# Patient Record
Sex: Female | Born: 1975
Health system: Southern US, Community
[De-identification: ages and names within clinical notes are randomized; demographics above are authoritative.]

## PROBLEM LIST (undated history)

## (undated) DIAGNOSIS — N920 Excessive and frequent menstruation with regular cycle: Secondary | ICD-10-CM

## (undated) DIAGNOSIS — F32A Depression, unspecified: Secondary | ICD-10-CM

## (undated) DIAGNOSIS — R7303 Prediabetes: Secondary | ICD-10-CM

## (undated) DIAGNOSIS — I499 Cardiac arrhythmia, unspecified: Secondary | ICD-10-CM

## (undated) DIAGNOSIS — F329 Major depressive disorder, single episode, unspecified: Secondary | ICD-10-CM

## (undated) DIAGNOSIS — D649 Anemia, unspecified: Secondary | ICD-10-CM

## (undated) DIAGNOSIS — K219 Gastro-esophageal reflux disease without esophagitis: Secondary | ICD-10-CM

## (undated) HISTORY — DX: Prediabetes: R73.03

## (undated) HISTORY — DX: Excessive and frequent menstruation with regular cycle: N92.0

## (undated) HISTORY — PX: TUBAL LIGATION: SHX77

## (undated) HISTORY — DX: Cardiac arrhythmia, unspecified: I49.9

## (undated) HISTORY — PX: BREAST EXCISIONAL BIOPSY: SUR124

## (undated) HISTORY — PX: OTHER SURGICAL HISTORY: SHX169

---

## 1997-10-06 ENCOUNTER — Inpatient Hospital Stay (HOSPITAL_COMMUNITY): Admission: AD | Admit: 1997-10-06 | Discharge: 1997-10-06 | Payer: Self-pay | Admitting: Obstetrics

## 1998-01-01 ENCOUNTER — Emergency Department (HOSPITAL_COMMUNITY): Admission: EM | Admit: 1998-01-01 | Discharge: 1998-01-01 | Payer: Self-pay | Admitting: Emergency Medicine

## 1998-09-04 ENCOUNTER — Inpatient Hospital Stay (HOSPITAL_COMMUNITY): Admission: AD | Admit: 1998-09-04 | Discharge: 1998-09-04 | Payer: Self-pay | Admitting: Obstetrics

## 1998-11-05 ENCOUNTER — Emergency Department (HOSPITAL_COMMUNITY): Admission: EM | Admit: 1998-11-05 | Discharge: 1998-11-06 | Payer: Self-pay | Admitting: Emergency Medicine

## 1998-11-07 ENCOUNTER — Encounter: Payer: Self-pay | Admitting: Emergency Medicine

## 1998-11-07 ENCOUNTER — Emergency Department (HOSPITAL_COMMUNITY): Admission: EM | Admit: 1998-11-07 | Discharge: 1998-11-07 | Payer: Self-pay | Admitting: Emergency Medicine

## 1998-11-23 ENCOUNTER — Ambulatory Visit (HOSPITAL_COMMUNITY): Admission: RE | Admit: 1998-11-23 | Discharge: 1998-11-23 | Payer: Self-pay | Admitting: *Deleted

## 1998-12-22 ENCOUNTER — Ambulatory Visit (HOSPITAL_COMMUNITY): Admission: RE | Admit: 1998-12-22 | Discharge: 1998-12-22 | Payer: Self-pay | Admitting: *Deleted

## 1999-03-18 ENCOUNTER — Inpatient Hospital Stay (HOSPITAL_COMMUNITY): Admission: AD | Admit: 1999-03-18 | Discharge: 1999-03-18 | Payer: Self-pay | Admitting: *Deleted

## 1999-03-22 ENCOUNTER — Inpatient Hospital Stay (HOSPITAL_COMMUNITY): Admission: AD | Admit: 1999-03-22 | Discharge: 1999-03-22 | Payer: Self-pay | Admitting: *Deleted

## 1999-04-19 ENCOUNTER — Inpatient Hospital Stay (HOSPITAL_COMMUNITY): Admission: AD | Admit: 1999-04-19 | Discharge: 1999-04-19 | Payer: Self-pay | Admitting: Obstetrics

## 1999-05-03 ENCOUNTER — Inpatient Hospital Stay (HOSPITAL_COMMUNITY): Admission: AD | Admit: 1999-05-03 | Discharge: 1999-05-03 | Payer: Self-pay | Admitting: *Deleted

## 1999-05-08 ENCOUNTER — Inpatient Hospital Stay (HOSPITAL_COMMUNITY): Admission: AD | Admit: 1999-05-08 | Discharge: 1999-05-08 | Payer: Self-pay | Admitting: Obstetrics

## 1999-05-09 ENCOUNTER — Inpatient Hospital Stay (HOSPITAL_COMMUNITY): Admission: AD | Admit: 1999-05-09 | Discharge: 1999-05-09 | Payer: Self-pay | Admitting: *Deleted

## 1999-05-12 ENCOUNTER — Inpatient Hospital Stay (HOSPITAL_COMMUNITY): Admission: AD | Admit: 1999-05-12 | Discharge: 1999-05-12 | Payer: Self-pay | Admitting: Obstetrics

## 1999-05-15 ENCOUNTER — Inpatient Hospital Stay (HOSPITAL_COMMUNITY): Admission: AD | Admit: 1999-05-15 | Discharge: 1999-05-19 | Payer: Self-pay | Admitting: Obstetrics

## 2000-03-23 ENCOUNTER — Inpatient Hospital Stay (HOSPITAL_COMMUNITY): Admission: AD | Admit: 2000-03-23 | Discharge: 2000-03-23 | Payer: Self-pay | Admitting: *Deleted

## 2000-07-01 ENCOUNTER — Ambulatory Visit (HOSPITAL_COMMUNITY): Admission: RE | Admit: 2000-07-01 | Discharge: 2000-07-01 | Payer: Self-pay | Admitting: *Deleted

## 2000-07-01 ENCOUNTER — Encounter: Payer: Self-pay | Admitting: *Deleted

## 2000-10-17 ENCOUNTER — Observation Stay (HOSPITAL_COMMUNITY): Admission: AD | Admit: 2000-10-17 | Discharge: 2000-10-18 | Payer: Self-pay | Admitting: *Deleted

## 2000-10-27 ENCOUNTER — Inpatient Hospital Stay (HOSPITAL_COMMUNITY): Admission: AD | Admit: 2000-10-27 | Discharge: 2000-10-27 | Payer: Self-pay | Admitting: *Deleted

## 2000-11-01 ENCOUNTER — Observation Stay (HOSPITAL_COMMUNITY): Admission: AD | Admit: 2000-11-01 | Discharge: 2000-11-02 | Payer: Self-pay | Admitting: *Deleted

## 2000-11-02 ENCOUNTER — Observation Stay (HOSPITAL_COMMUNITY): Admission: AD | Admit: 2000-11-02 | Discharge: 2000-11-03 | Payer: Self-pay | Admitting: *Deleted

## 2000-11-08 ENCOUNTER — Inpatient Hospital Stay (HOSPITAL_COMMUNITY): Admission: AD | Admit: 2000-11-08 | Discharge: 2000-11-08 | Payer: Self-pay | Admitting: *Deleted

## 2000-11-09 ENCOUNTER — Inpatient Hospital Stay (HOSPITAL_COMMUNITY): Admission: AD | Admit: 2000-11-09 | Discharge: 2000-11-12 | Payer: Self-pay | Admitting: *Deleted

## 2000-12-18 HISTORY — PX: TUBAL LIGATION: SHX77

## 2001-01-16 ENCOUNTER — Ambulatory Visit (HOSPITAL_COMMUNITY): Admission: RE | Admit: 2001-01-16 | Discharge: 2001-01-16 | Payer: Self-pay | Admitting: *Deleted

## 2001-04-27 ENCOUNTER — Inpatient Hospital Stay (HOSPITAL_COMMUNITY): Admit: 2001-04-27 | Discharge: 2001-04-27 | Payer: Self-pay | Admitting: Obstetrics & Gynecology

## 2001-05-16 ENCOUNTER — Encounter: Admission: RE | Admit: 2001-05-16 | Discharge: 2001-05-16 | Payer: Self-pay | Admitting: Obstetrics & Gynecology

## 2001-05-22 ENCOUNTER — Encounter: Admission: RE | Admit: 2001-05-22 | Discharge: 2001-05-22 | Payer: Self-pay | Admitting: Obstetrics & Gynecology

## 2001-05-22 ENCOUNTER — Encounter: Payer: Self-pay | Admitting: Obstetrics & Gynecology

## 2002-08-03 ENCOUNTER — Encounter: Payer: Self-pay | Admitting: Emergency Medicine

## 2002-08-03 ENCOUNTER — Emergency Department (HOSPITAL_COMMUNITY): Admission: EM | Admit: 2002-08-03 | Discharge: 2002-08-03 | Payer: Self-pay | Admitting: Emergency Medicine

## 2005-04-03 ENCOUNTER — Inpatient Hospital Stay (HOSPITAL_COMMUNITY): Admission: AD | Admit: 2005-04-03 | Discharge: 2005-04-03 | Payer: Self-pay | Admitting: Family Medicine

## 2006-07-09 ENCOUNTER — Emergency Department (HOSPITAL_COMMUNITY): Admission: EM | Admit: 2006-07-09 | Discharge: 2006-07-09 | Payer: Self-pay | Admitting: Emergency Medicine

## 2008-01-19 ENCOUNTER — Emergency Department (HOSPITAL_COMMUNITY): Admission: EM | Admit: 2008-01-19 | Discharge: 2008-01-19 | Payer: Self-pay | Admitting: Emergency Medicine

## 2008-02-18 ENCOUNTER — Emergency Department (HOSPITAL_COMMUNITY): Admission: EM | Admit: 2008-02-18 | Discharge: 2008-02-19 | Payer: Self-pay | Admitting: Family Medicine

## 2009-02-01 ENCOUNTER — Emergency Department (HOSPITAL_COMMUNITY): Admission: EM | Admit: 2009-02-01 | Discharge: 2009-02-01 | Payer: Self-pay | Admitting: Family Medicine

## 2009-04-21 ENCOUNTER — Inpatient Hospital Stay (HOSPITAL_COMMUNITY): Admission: AD | Admit: 2009-04-21 | Discharge: 2009-04-21 | Payer: Self-pay | Admitting: Obstetrics

## 2009-10-12 ENCOUNTER — Emergency Department (HOSPITAL_COMMUNITY): Admission: EM | Admit: 2009-10-12 | Discharge: 2009-10-12 | Payer: Self-pay | Admitting: Emergency Medicine

## 2010-01-19 ENCOUNTER — Emergency Department (HOSPITAL_COMMUNITY): Admission: EM | Admit: 2010-01-19 | Discharge: 2010-01-19 | Payer: Self-pay | Admitting: Emergency Medicine

## 2010-02-23 ENCOUNTER — Inpatient Hospital Stay (HOSPITAL_COMMUNITY): Admission: AD | Admit: 2010-02-23 | Discharge: 2010-02-23 | Payer: Self-pay | Admitting: Obstetrics & Gynecology

## 2010-09-28 ENCOUNTER — Emergency Department (HOSPITAL_COMMUNITY)
Admission: EM | Admit: 2010-09-28 | Discharge: 2010-09-28 | Disposition: A | Discharge status: Home / Self Care | Payer: Self-pay | Attending: Emergency Medicine | Admitting: Emergency Medicine

## 2010-09-28 ENCOUNTER — Emergency Department (HOSPITAL_COMMUNITY): Payer: Self-pay

## 2010-09-28 DIAGNOSIS — R0602 Shortness of breath: Secondary | ICD-10-CM | POA: Insufficient documentation

## 2010-09-28 DIAGNOSIS — R059 Cough, unspecified: Secondary | ICD-10-CM | POA: Insufficient documentation

## 2010-09-28 DIAGNOSIS — R3589 Other polyuria: Secondary | ICD-10-CM | POA: Insufficient documentation

## 2010-09-28 DIAGNOSIS — R358 Other polyuria: Secondary | ICD-10-CM | POA: Insufficient documentation

## 2010-09-28 DIAGNOSIS — R509 Fever, unspecified: Secondary | ICD-10-CM | POA: Insufficient documentation

## 2010-09-28 DIAGNOSIS — R110 Nausea: Secondary | ICD-10-CM | POA: Insufficient documentation

## 2010-09-28 DIAGNOSIS — K219 Gastro-esophageal reflux disease without esophagitis: Secondary | ICD-10-CM | POA: Insufficient documentation

## 2010-09-28 DIAGNOSIS — N12 Tubulo-interstitial nephritis, not specified as acute or chronic: Secondary | ICD-10-CM | POA: Insufficient documentation

## 2010-09-28 DIAGNOSIS — R109 Unspecified abdominal pain: Secondary | ICD-10-CM | POA: Insufficient documentation

## 2010-09-28 DIAGNOSIS — R05 Cough: Secondary | ICD-10-CM | POA: Insufficient documentation

## 2010-09-28 LAB — URINE MICROSCOPIC-ADD ON

## 2010-09-28 LAB — URINALYSIS, ROUTINE W REFLEX MICROSCOPIC
Ketones, ur: NEGATIVE mg/dL
Specific Gravity, Urine: 1.02 (ref 1.005–1.030)
Urobilinogen, UA: 1 mg/dL (ref 0.0–1.0)
pH: 5.5 (ref 5.0–8.0)

## 2010-11-05 LAB — URINALYSIS, ROUTINE W REFLEX MICROSCOPIC
Leukocytes, UA: NEGATIVE
Protein, ur: NEGATIVE mg/dL
Specific Gravity, Urine: 1.025 (ref 1.005–1.030)
Urobilinogen, UA: 0.2 mg/dL (ref 0.0–1.0)

## 2010-11-05 LAB — URINE MICROSCOPIC-ADD ON

## 2010-11-05 LAB — GC/CHLAMYDIA PROBE AMP, GENITAL
Chlamydia, DNA Probe: NEGATIVE
GC Probe Amp, Genital: NEGATIVE

## 2010-11-24 LAB — URINE MICROSCOPIC-ADD ON

## 2010-11-24 LAB — URINALYSIS, ROUTINE W REFLEX MICROSCOPIC
Nitrite: NEGATIVE
Specific Gravity, Urine: 1.02 (ref 1.005–1.030)
Urobilinogen, UA: 1 mg/dL (ref 0.0–1.0)
pH: 7 (ref 5.0–8.0)

## 2010-11-24 LAB — WET PREP, GENITAL
Trich, Wet Prep: NONE SEEN
Yeast Wet Prep HPF POC: NONE SEEN

## 2010-11-24 LAB — URINE CULTURE

## 2010-11-24 LAB — POCT PREGNANCY, URINE: Preg Test, Ur: NEGATIVE

## 2011-05-17 LAB — COMPREHENSIVE METABOLIC PANEL
ALT: 28
BUN: 6
Calcium: 9.1
Glucose, Bld: 92
Sodium: 140
Total Protein: 7

## 2011-05-17 LAB — CBC
Hemoglobin: 11.1 — ABNORMAL LOW
MCHC: 32.5
RDW: 17 — ABNORMAL HIGH

## 2011-05-17 LAB — DIFFERENTIAL
Lymphs Abs: 2.1
Monocytes Relative: 9
Neutro Abs: 4.1
Neutrophils Relative %: 58

## 2011-05-17 LAB — URINALYSIS, ROUTINE W REFLEX MICROSCOPIC
Glucose, UA: NEGATIVE
pH: 7

## 2011-05-17 LAB — T3 UPTAKE: T3 Uptake Ratio: 26.3

## 2011-05-17 LAB — T4, FREE: Free T4: 1.25

## 2011-05-17 LAB — POCT PREGNANCY, URINE
Operator id: 239701
Preg Test, Ur: NEGATIVE

## 2011-05-17 LAB — URINE MICROSCOPIC-ADD ON

## 2011-12-12 ENCOUNTER — Encounter (HOSPITAL_COMMUNITY): Payer: Self-pay

## 2011-12-12 ENCOUNTER — Inpatient Hospital Stay (HOSPITAL_COMMUNITY)
Admission: AD | Admit: 2011-12-12 | Discharge: 2011-12-12 | Disposition: A | Discharge status: Home / Self Care | Payer: Self-pay | Source: Ambulatory Visit | Attending: Obstetrics & Gynecology | Admitting: Obstetrics & Gynecology

## 2011-12-12 DIAGNOSIS — R109 Unspecified abdominal pain: Secondary | ICD-10-CM | POA: Insufficient documentation

## 2011-12-12 DIAGNOSIS — A499 Bacterial infection, unspecified: Secondary | ICD-10-CM

## 2011-12-12 DIAGNOSIS — B9689 Other specified bacterial agents as the cause of diseases classified elsewhere: Secondary | ICD-10-CM

## 2011-12-12 DIAGNOSIS — N760 Acute vaginitis: Secondary | ICD-10-CM

## 2011-12-12 HISTORY — DX: Anemia, unspecified: D64.9

## 2011-12-12 HISTORY — DX: Major depressive disorder, single episode, unspecified: F32.9

## 2011-12-12 HISTORY — DX: Depression, unspecified: F32.A

## 2011-12-12 LAB — WET PREP, GENITAL: Trich, Wet Prep: NONE SEEN

## 2011-12-12 LAB — CBC
MCH: 30.9 pg (ref 26.0–34.0)
MCHC: 32.8 g/dL (ref 30.0–36.0)
MCV: 94.1 fL (ref 78.0–100.0)
Platelets: 238 10*3/uL (ref 150–400)
RBC: 4.24 MIL/uL (ref 3.87–5.11)
RDW: 14.2 % (ref 11.5–15.5)

## 2011-12-12 LAB — DIFFERENTIAL
Basophils Relative: 0 % (ref 0–1)
Eosinophils Absolute: 0.1 10*3/uL (ref 0.0–0.7)
Eosinophils Relative: 2 % (ref 0–5)
Lymphs Abs: 2 10*3/uL (ref 0.7–4.0)
Neutrophils Relative %: 58 % (ref 43–77)

## 2011-12-12 LAB — URINALYSIS, ROUTINE W REFLEX MICROSCOPIC
Bilirubin Urine: NEGATIVE
Glucose, UA: NEGATIVE mg/dL
Hgb urine dipstick: NEGATIVE
Specific Gravity, Urine: 1.015 (ref 1.005–1.030)
pH: 6 (ref 5.0–8.0)

## 2011-12-12 LAB — POCT PREGNANCY, URINE: Preg Test, Ur: NEGATIVE

## 2011-12-12 MED ORDER — METRONIDAZOLE 500 MG PO TABS: 500.0000 mg | ORAL_TABLET | Freq: Two times a day (BID) | ORAL | Status: AC

## 2011-12-12 NOTE — MAU Note (Signed)
Patient states she had had left lower abdominal pain for about 3 days. Has vaginal irritation but no discharge. Has a history of UTI and this feels like that.

## 2011-12-12 NOTE — MAU Note (Signed)
Pt states left sided lower abdominal pain. Vaginal irritation, denies odor or discharge. Denies urgency or burning. Hx frequent uti's. Last intercourse 12/05/2011.

## 2011-12-12 NOTE — Discharge Instructions (Signed)
Bacterial Vaginosis Bacterial vaginosis is an infection of the vagina. A healthy vagina has many kinds of good germs (bacteria). Sometimes the number of good germs can change. This allows bad germs to move in and cause an infection. You may be given medicine (antibiotics) to treat the infection. Or, you may not need treatment at all. HOME CARE  Take your medicine as told. Finish them even if you start to feel better.   Do not have sex until you finish your medicine.   Do not douche.   Practice safe sex.   Tell your sex partner that you have an infection. They should see their doctor for treatment if they have problems.  GET HELP RIGHT AWAY IF:  You do not get better after 3 days of treatment.   You have grey fluid (discharge) coming from your vagina.   You have pain.   You have a temperature of 102 F (38.9 C) or higher.  MAKE SURE YOU:   Understand these instructions.   Will watch your condition.   Will get help right away if you are not doing well or get worse.  Document Released: 05/15/2008 Document Revised: 07/26/2011 Document Reviewed: 05/15/2008 [employer] Patient Information 2012 [employer].Bacterial Vaginosis Bacterial vaginosis is an infection of the vagina. A healthy vagina has many kinds of good germs (bacteria). Sometimes the number of good germs can change. This allows bad germs to move in and cause an infection. You may be given medicine (antibiotics) to treat the infection. Or, you may not need treatment at all. HOME CARE  Take your medicine as told. Finish them even if you start to feel better.   Do not have sex until you finish your medicine.   Do not douche.   Practice safe sex.   Tell your sex partner that you have an infection. They should see their doctor for treatment if they have problems.  GET HELP RIGHT AWAY IF:  You do not get better after 3 days of treatment.   You have grey fluid (discharge) coming from your vagina.   You have pain.    You have a temperature of 102 F (38.9 C) or higher.  MAKE SURE YOU:   Understand these instructions.   Will watch your condition.   Will get help right away if you are not doing well or get worse.  Document Released: 05/15/2008 Document Revised: 07/26/2011 Document Reviewed: 05/15/2008 [employer] Patient Information 2012 [employer]. 

## 2011-12-12 NOTE — MAU Provider Note (Signed)
History     CSN: [id number]  Arrival date and time: 12/12/11 1226   First Provider Initiated Contact with Patient 12/12/11 1346      Chief Complaint  Patient presents with  . Abdominal Pain   HPI Ashley Ford is 36 y.o. [id number] lower left sided pain and vaginal irritation and swelling that began 3days ago.  Sexually active with one partner X 7 years.  Had BTL 2002.  She does not have a doctor.  Had cancer screening at [facility] clinic at [facility] in 2008, they saw a cyst but told her it was normal. LMP 11/24/11.  They are regular.  Denies fever, nausea, vomiting, constipation, diarrhea and poor appetite.   Denies abnormal vaginal discharge or bleeding.  Is concerned about infections.    Past Medical History  Diagnosis Date  . Anemia   . Depression     Past Surgical History  Procedure Date  . Other surgical history     fibroids removed from breasts  . Tubal ligation     Family History  Problem Relation Age of Onset  . Anesthesia problems Neg Hx   . Hypotension Neg Hx   . Malignant hyperthermia Neg Hx   . Pseudochol deficiency Neg Hx     History  Substance Use Topics  . Smoking status: Current Everyday Smoker -- 0.2 packs/day    Types: Cigarettes  . Smokeless tobacco: Never Used  . Alcohol Use: No    Allergies:  Allergies  Allergen Reactions  . Penicillins Other (See Comments)    Swelling and hives    Prescriptions prior to admission  Medication Sig Dispense Refill  . diphenhydrAMINE (BENADRYL) 25 MG tablet Take 25 mg by mouth every 6 (six) hours as needed. For allergies      . Pseudoephedrine-Ibuprofen (AF-IBUP SINUS) 30-200 MG TABS Take 1 tablet by mouth daily as needed. For allergies        Review of Systems  Constitutional: Negative for fever and chills.  Gastrointestinal: Positive for abdominal pain (left lower abdomen). Negative for nausea and vomiting.  Genitourinary: Negative for dysuria and frequency.       + for vaginal irritation and ? swelling    Physical Exam   Blood pressure 117/69, pulse 65, temperature 97.8 F (36.6 C), temperature source Oral, resp. rate 16, height 5\' 10"  (1.778 m), weight 90.084 kg (198 lb 9.6 oz), last menstrual period 11/24/2011, SpO2 99.00%.  Physical Exam  Constitutional: She is oriented to person, place, and time. She appears well-developed and well-nourished. No distress.  HENT:  Head: Normocephalic.  Neck: Normal range of motion.  Cardiovascular: Normal rate.   Respiratory: Effort normal.  GI: Soft. There is tenderness (left lower quadrant, mild). There is no rebound and no guarding.  Genitourinary: There is no tenderness on the right labia. There is no tenderness on the left labia. Uterus is not enlarged and not tender. Cervix exhibits no motion tenderness, no discharge and no friability. Right adnexum displays no mass, no tenderness and no fullness. Left adnexum displays no mass and no fullness. Tenderness: mild  No erythema, tenderness or bleeding around the vagina. Vaginal discharge (white without odor, few clumps) found.  Neurological: She is alert and oriented to person, place, and time.  Skin: Skin is warm and dry.  Psychiatric: She has a normal mood and affect. Her behavior is normal. Judgment and thought content normal.   Results for orders placed during the hospital encounter of 12/12/11 (from the past 24 hour(s))  URINALYSIS,  ROUTINE W REFLEX MICROSCOPIC     Status: Normal   Collection Time   12/12/11 12:45 PM      Component Value Range   Color, Urine YELLOW  YELLOW    APPearance CLEAR  CLEAR    Specific Gravity, Urine 1.015  1.005 - 1.030    pH 6.0  5.0 - 8.0    Glucose, UA NEGATIVE  NEGATIVE (mg/dL)   Hgb urine dipstick NEGATIVE  NEGATIVE    Bilirubin Urine NEGATIVE  NEGATIVE    Ketones, ur NEGATIVE  NEGATIVE (mg/dL)   Protein, ur NEGATIVE  NEGATIVE (mg/dL)   Urobilinogen, UA 0.2  0.0 - 1.0 (mg/dL)   Nitrite NEGATIVE  NEGATIVE    Leukocytes, UA NEGATIVE  NEGATIVE   POCT  PREGNANCY, URINE     Status: Normal   Collection Time   12/12/11  1:04 PM      Component Value Range   Preg Test, Ur NEGATIVE  NEGATIVE   WET PREP, GENITAL     Status: Abnormal   Collection Time   12/12/11  1:58 PM      Component Value Range   Yeast Wet Prep HPF POC FEW (*) NONE SEEN    Trich, Wet Prep NONE SEEN  NONE SEEN    Clue Cells Wet Prep HPF POC MANY (*) NONE SEEN    WBC, Wet Prep HPF POC FEW (*) NONE SEEN   CBC     Status: Normal   Collection Time   12/12/11  2:20 PM      Component Value Range   WBC 6.1  4.0 - 10.5 (K/uL)   RBC 4.24  3.87 - 5.11 (MIL/uL)   Hemoglobin 13.1  12.0 - 15.0 (g/dL)   HCT 29.5  28.4 - 13.2 (%)   MCV 94.1  78.0 - 100.0 (fL)   MCH 30.9  26.0 - 34.0 (pg)   MCHC 32.8  30.0 - 36.0 (g/dL)   RDW 44.0  10.2 - 72.5 (%)   Platelets 238  150 - 400 (K/uL)  DIFFERENTIAL     Status: Normal   Collection Time   12/12/11  2:20 PM      Component Value Range   Neutrophils Relative 58  43 - 77 (%)   Neutro Abs 3.6  1.7 - 7.7 (K/uL)   Lymphocytes Relative 33  12 - 46 (%)   Lymphs Abs 2.0  0.7 - 4.0 (K/uL)   Monocytes Relative 7  3 - 12 (%)   Monocytes Absolute 0.4  0.1 - 1.0 (K/uL)   Eosinophils Relative 2  0 - 5 (%)   Eosinophils Absolute 0.1  0.0 - 0.7 (K/uL)   Basophils Relative 0  0 - 1 (%)   Basophils Absolute 0.0  0.0 - 0.1 (K/uL)   MAU Course  Procedures  GC/CHL culture to lab  MDM  Instructions given to take all medication and avoid intercourse for 1 week.   Assessment and Plan  A: Bacterial  Vaginosis    P:  Rx for flagyl       Follow up with MD of her choice if needed  Bond Grieshop,EVE M 12/12/2011, 1:47 PM

## 2011-12-13 LAB — GC/CHLAMYDIA PROBE AMP, GENITAL: GC Probe Amp, Genital: NEGATIVE

## 2011-12-13 NOTE — MAU Provider Note (Signed)
Attestation of Attending Supervision of Advanced Practitioner: Evaluation and management procedures were performed by the [facility] Fellow/PA/CNM/NP under my supervision and collaboration. Chart reviewed, and agree with management and plan.  Jaynie Collins, M.D. 12/13/2011 11:02 AM

## 2012-10-16 ENCOUNTER — Emergency Department (HOSPITAL_COMMUNITY)
Admission: EM | Admit: 2012-10-16 | Discharge: 2012-10-16 | Disposition: A | Discharge status: Home / Self Care | Payer: Self-pay | Attending: Emergency Medicine | Admitting: Emergency Medicine

## 2012-10-16 ENCOUNTER — Encounter (HOSPITAL_COMMUNITY): Payer: Self-pay | Admitting: Emergency Medicine

## 2012-10-16 DIAGNOSIS — S025XXA Fracture of tooth (traumatic), initial encounter for closed fracture: Secondary | ICD-10-CM | POA: Insufficient documentation

## 2012-10-16 DIAGNOSIS — K0401 Reversible pulpitis: Secondary | ICD-10-CM | POA: Insufficient documentation

## 2012-10-16 DIAGNOSIS — K044 Acute apical periodontitis of pulpal origin: Secondary | ICD-10-CM | POA: Insufficient documentation

## 2012-10-16 DIAGNOSIS — F17200 Nicotine dependence, unspecified, uncomplicated: Secondary | ICD-10-CM | POA: Insufficient documentation

## 2012-10-16 DIAGNOSIS — Y939 Activity, unspecified: Secondary | ICD-10-CM | POA: Insufficient documentation

## 2012-10-16 DIAGNOSIS — Z862 Personal history of diseases of the blood and blood-forming organs and certain disorders involving the immune mechanism: Secondary | ICD-10-CM | POA: Insufficient documentation

## 2012-10-16 DIAGNOSIS — X58XXXA Exposure to other specified factors, initial encounter: Secondary | ICD-10-CM | POA: Insufficient documentation

## 2012-10-16 DIAGNOSIS — K047 Periapical abscess without sinus: Secondary | ICD-10-CM

## 2012-10-16 DIAGNOSIS — Z8659 Personal history of other mental and behavioral disorders: Secondary | ICD-10-CM | POA: Insufficient documentation

## 2012-10-16 DIAGNOSIS — Y929 Unspecified place or not applicable: Secondary | ICD-10-CM | POA: Insufficient documentation

## 2012-10-16 DIAGNOSIS — R220 Localized swelling, mass and lump, head: Secondary | ICD-10-CM | POA: Insufficient documentation

## 2012-10-16 DIAGNOSIS — R5383 Other fatigue: Secondary | ICD-10-CM | POA: Insufficient documentation

## 2012-10-16 DIAGNOSIS — R5381 Other malaise: Secondary | ICD-10-CM | POA: Insufficient documentation

## 2012-10-16 MED ORDER — IBUPROFEN 600 MG PO TABS: 600.0000 mg | ORAL_TABLET | Freq: Four times a day (QID) | ORAL | Status: DC | PRN

## 2012-10-16 MED ORDER — CLINDAMYCIN HCL 150 MG PO CAPS: 150.0000 mg | ORAL_CAPSULE | Freq: Four times a day (QID) | ORAL | Status: DC

## 2012-10-16 MED ORDER — CLINDAMYCIN HCL 150 MG PO CAPS
150.0000 mg | ORAL_CAPSULE | Freq: Once | ORAL | Status: AC
  Administered 2012-10-16: 150 mg via ORAL
  Filled 2012-10-16: qty 1

## 2012-10-16 MED ORDER — IBUPROFEN 400 MG PO TABS
600.0000 mg | ORAL_TABLET | Freq: Once | ORAL | Status: AC
Start: 2012-10-16 — End: 2012-10-16
  Administered 2012-10-16: 600 mg via ORAL
  Filled 2012-10-16: qty 1

## 2012-10-16 NOTE — ED Provider Notes (Signed)
History     CSN: [id number]  Arrival date & time 10/16/12  7829   First MD Initiated Contact with Patient 10/16/12 [phone]      Chief Complaint  Patient presents with  . Dental Pain    (Consider location/radiation/quality/duration/timing/severity/associated sxs/prior treatment) HPI Comments: 37 yo female presents with tooth ache and malaise. She states she had atraumatic fracture/avulsion of left upper 3rd molar last week. Since has gradually developed malaise. She states the tooth initially hurt but now is not painful. She feels as though she may have swelling to left side of cheek. She has no fever or chills. She has had no drainage. She has had no trouble swallowing, trismus, or difficulty breathing. She has history of multiple caries, fillings, and teeth being pulled. Affected tooth has history of filling. She denies other significant PMH and states she takes no medicines every day. She is allergic to Penicillin.   Patient is a 37 y.o. female presenting with tooth pain. The history is provided by the patient. No language interpreter was used.  Dental PainThe primary symptoms include mouth pain and dental injury. Primary symptoms do not include headaches, fever, shortness of breath, sore throat or cough.    Past Medical History  Diagnosis Date  . Anemia   . Depression     Past Surgical History  Procedure Laterality Date  . Other surgical history      fibroids removed from breasts  . Tubal ligation      Family History  Problem Relation Age of Onset  . Anesthesia problems Neg Hx   . Hypotension Neg Hx   . Malignant hyperthermia Neg Hx   . Pseudochol deficiency Neg Hx     History  Substance Use Topics  . Smoking status: Current Every Day Smoker -- 0.25 packs/day    Types: Cigarettes  . Smokeless tobacco: Never Used  . Alcohol Use: No    OB History   Grav Para Term Preterm Abortions TAB SAB Ect Mult Living   3 2 2  1  1   2       Review of Systems  Constitutional:  Negative for fever and chills.  HENT: Positive for dental problem. Negative for sore throat and neck pain.   Eyes: Negative for visual disturbance.  Respiratory: Negative for cough and shortness of breath.   Cardiovascular: Negative for chest pain.  Gastrointestinal: Negative for nausea, vomiting, abdominal pain and diarrhea.  Genitourinary: Negative for dysuria and frequency.  Musculoskeletal: Negative for back pain.  Skin: Negative for rash.  Neurological: Negative for weakness, numbness and headaches.  Hematological: Negative for adenopathy.  Psychiatric/Behavioral: Negative for behavioral problems.    Allergies  Penicillins  Home Medications   Current Outpatient Rx  Name  Route  Sig  Dispense  Refill  . ibuprofen (ADVIL,MOTRIN) 200 MG tablet   Oral   Take 200 mg by mouth every 6 (six) hours as needed for pain or fever.         . clindamycin (CLEOCIN) 150 MG capsule   Oral   Take 1 capsule (150 mg total) by mouth every 6 (six) hours.   28 capsule   0   . ibuprofen (ADVIL,MOTRIN) 600 MG tablet   Oral   Take 1 tablet (600 mg total) by mouth every 6 (six) hours as needed for pain.   30 tablet   0     BP 112/80  Pulse 80  Temp(Src) 98.6 F (37 C) (Oral)  Resp 18  SpO2 98%  Physical Exam  Nursing note and vitals reviewed. Constitutional: She appears well-developed and well-nourished. No distress.  Well appearing female with normal VS.  HENT:  Head: Normocephalic and atraumatic.  Mouth/Throat: Oropharynx is clear and moist. No oropharyngeal exudate.    Filling present. Tooth is fractured to posterior aspect. No dentin or pulp exposed. Tooth is non tender to percussion. There is no abscess or cellulitis noted.  Eyes: Conjunctivae and EOM are normal. Pupils are equal, round, and reactive to light. Right eye exhibits no discharge. Left eye exhibits no discharge. No scleral icterus.  Neck: Normal range of motion. Neck supple. No JVD present. No thyromegaly present.   Cardiovascular: Normal rate, regular rhythm, normal heart sounds and intact distal pulses.  Exam reveals no gallop and no friction rub.   No murmur heard. Pulmonary/Chest: Effort normal and breath sounds normal. No respiratory distress. She has no wheezes. She has no rales.  Abdominal: Soft. Bowel sounds are normal. She exhibits no distension and no mass. There is no tenderness.  Musculoskeletal: Normal range of motion. She exhibits no edema and no tenderness.  Lymphadenopathy:    She has no cervical adenopathy.  Neurological: She is alert. Coordination normal.  Skin: Skin is warm and dry. No rash noted. No erythema.  Psychiatric: She has a normal mood and affect. Her behavior is normal.    ED Course  Procedures (including critical care time)  Labs Reviewed - No data to display No results found.   1. Dental infection       MDM  37 yo female with dental fracture/pain now resolving with some malaise and concern for developing abscess. DDx: pulpitis, dental fracture, early infection. Treat with Clindamycin and dental follow up in 48 hours. Tooth will likely need to be extracted.         Vida Roller, MD 10/16/12 1006

## 2012-10-16 NOTE — ED Notes (Signed)
Pt c/o left upper dental pain x several weeks from broken tooth

## 2012-10-20 ENCOUNTER — Telehealth (HOSPITAL_COMMUNITY): Payer: Self-pay | Admitting: Emergency Medicine

## 2012-10-20 NOTE — ED Notes (Signed)
Call from on call dentist requesting demographics and referral be faxed to their office. 

## 2014-06-01 ENCOUNTER — Emergency Department (HOSPITAL_COMMUNITY)
Admission: EM | Admit: 2014-06-01 | Discharge: 2014-06-01 | Disposition: A | Discharge status: Home / Self Care | Payer: Self-pay | Attending: Emergency Medicine | Admitting: Emergency Medicine

## 2014-06-01 ENCOUNTER — Encounter (HOSPITAL_COMMUNITY): Payer: Self-pay | Admitting: Emergency Medicine

## 2014-06-01 DIAGNOSIS — Z880 Allergy status to penicillin: Secondary | ICD-10-CM | POA: Insufficient documentation

## 2014-06-01 DIAGNOSIS — Z862 Personal history of diseases of the blood and blood-forming organs and certain disorders involving the immune mechanism: Secondary | ICD-10-CM | POA: Insufficient documentation

## 2014-06-01 DIAGNOSIS — B3731 Acute candidiasis of vulva and vagina: Secondary | ICD-10-CM

## 2014-06-01 DIAGNOSIS — Z8719 Personal history of other diseases of the digestive system: Secondary | ICD-10-CM | POA: Insufficient documentation

## 2014-06-01 DIAGNOSIS — Z720 Tobacco use: Secondary | ICD-10-CM | POA: Insufficient documentation

## 2014-06-01 DIAGNOSIS — B373 Candidiasis of vulva and vagina: Secondary | ICD-10-CM | POA: Insufficient documentation

## 2014-06-01 DIAGNOSIS — Z8659 Personal history of other mental and behavioral disorders: Secondary | ICD-10-CM | POA: Insufficient documentation

## 2014-06-01 DIAGNOSIS — Z3202 Encounter for pregnancy test, result negative: Secondary | ICD-10-CM | POA: Insufficient documentation

## 2014-06-01 HISTORY — DX: Gastro-esophageal reflux disease without esophagitis: K21.9

## 2014-06-01 LAB — WET PREP, GENITAL
Clue Cells Wet Prep HPF POC: NONE SEEN
TRICH WET PREP: NONE SEEN
YEAST WET PREP: NONE SEEN

## 2014-06-01 LAB — URINALYSIS, ROUTINE W REFLEX MICROSCOPIC
BILIRUBIN URINE: NEGATIVE
Glucose, UA: NEGATIVE mg/dL
HGB URINE DIPSTICK: NEGATIVE
Ketones, ur: NEGATIVE mg/dL
NITRITE: NEGATIVE
PH: 5 (ref 5.0–8.0)
Protein, ur: NEGATIVE mg/dL
SPECIFIC GRAVITY, URINE: 1.018 (ref 1.005–1.030)
Urobilinogen, UA: 0.2 mg/dL (ref 0.0–1.0)

## 2014-06-01 LAB — URINE MICROSCOPIC-ADD ON

## 2014-06-01 LAB — HIV ANTIBODY (ROUTINE TESTING W REFLEX): HIV 1&2 Ab, 4th Generation: NONREACTIVE

## 2014-06-01 LAB — RPR

## 2014-06-01 LAB — PREGNANCY, URINE: PREG TEST UR: NEGATIVE

## 2014-06-01 MED ORDER — FLUCONAZOLE 200 MG PO TABS: 200.0000 mg | ORAL_TABLET | Freq: Every day | ORAL | Status: AC

## 2014-06-01 NOTE — Discharge Instructions (Signed)
Candidal Vulvovaginitis Candidal vulvovaginitis is an infection of the vagina and vulva. The vulva is the skin around the opening of the vagina. This may cause itching and discomfort in and around the vagina.  HOME CARE  Only take medicine as told by your doctor.  Do not have sex (intercourse) until the infection is healed or as told by your doctor.  Practice safe sex.  Tell your sex partner about your infection.  Do not douche or use tampons.  Wear cotton underwear. Do not wear tight pants or panty hose.  Eat yogurt. This may help treat and prevent yeast infections. GET HELP RIGHT AWAY IF:   You have a fever.  Your problems get worse during treatment or do not get better in 3 days.  You have discomfort, irritation, or itching in your vagina or vulva area.  You have pain after sex.  You start to get belly (abdominal) pain. MAKE SURE YOU:  Understand these instructions.  Will watch your condition.  Will get help right away if you are not doing well or get worse. Document Released: 11/02/2008 Document Revised: 08/11/2013 Document Reviewed: 11/02/2008 [facility] Patient Information 2015 [employer], [place]. This information is not intended to replace advice given to you by your health care provider. Make sure you discuss any questions you have with your health care provider.     Emergency Department Resource Guide 1) Find a Doctor and Pay Out of Pocket Although you won't have to find out who is covered by your insurance plan, it is a good idea to ask around and get recommendations. You will then need to call the office and see if the doctor you have chosen will accept you as a new patient and what types of options they offer for patients who are self-pay. Some doctors offer discounts or will set up payment plans for their patients who do not have insurance, but you will need to ask so you aren't surprised when you get to your appointment.  2) Contact Your Local Health  Department Not all health departments have doctors that can see patients for sick visits, but many do, so it is worth a call to see if yours does. If you don't know where your local health department is, you can check in your phone book. The CDC also has a tool to help you locate your state's health department, and many state websites also have listings of all of their local health departments.  3) Find a Walk-in Clinic If your illness is not likely to be very severe or complicated, you may want to try a walk in clinic. These are popping up all over the country in pharmacies, drugstores, and shopping centers. They're usually staffed by nurse practitioners or physician assistants that have been trained to treat common illnesses and complaints. They're usually fairly quick and inexpensive. However, if you have serious medical issues or chronic medical problems, these are probably not your best option.  No Primary Care Doctor: - Call Health Connect at  [phone] - they can help you locate a primary care doctor that  accepts your insurance, provides certain services, etc. - Physician Referral Service- [phone]  Chronic Pain Problems: Organization         Address  Phone   Notes  [facility]  [phone] Patients need to be referred by their primary care doctor.   Medication Assistance: Organization         Address  Phone   Notes  [facility]  Medication Assistance Program [address] [place], [place] [address] [phone] --Must be a resident of [facility] -- Must have NO insurance coverage whatsoever (no Medicaid/ Medicare, etc.) -- The pt. MUST have a primary care doctor that directs their care regularly and follows them in the community   MedAssist  [phone]   [employer]  [phone]    Agencies that provide inexpensive medical care: Organization         Address  Phone   Notes  [facility]  [phone]   [facility]    [phone]   [facility] [address] [place], [place] [address] [phone]   [facility] [address] [place], [place] [phone]   Planned Parenthood    [phone]   [facility]    [phone]   [facility]  [address], [place] Phone:  [phone], Fax:  [phone] Hours of Operation:  9 am - 6 pm, M-F.  Also accepts Medicaid/Medicare and self-pay.  [facility]  [address], [place] Phone: [phone], Fax: [phone]. Hours of Operation:  8:30 am - 5:30 pm, M-F.  Also accepts Medicaid and self-pay.  [facility] [place] [address], [place] Phone: [phone]   [facility] [address] Natasha Bence [place], [place] [phone], Ext. [phone] Mondays & Thursdays: 7-9 AM.  First 15 patients are seen on a first come, first serve basis.    Medicaid-accepting [facility] Providers:  Organization         Address  Phone   Notes  [facility] [address], [place] [phone] Also accepts self-pay patients.  [facility] [address] Laurell Josephs [place], [place]  [phone]   [facility] [address], [place] [phone]   [facility] [address], [place] [phone]   Renaye Rakers [address], [place]   [phone] Only accepts [place] patients after they have their name applied to their card.   Self-Pay (no insurance) in [facility]:  Organization         Address  Phone   Notes  Sickle Cell Patients, [facility] [address] [place], [place] [phone]   [facility] Urgent Care [address] [place], [place] [phone]   [facility] [place]  [address],  [place] [phone]   [facility]/Dr. Osei-Bonsu  [address], [place] or [address], [place] [phone] Phone number for both [place] and [place] locations is the same.  [facility] [address] [place] [phone]   [facility] [address] [place] or [address] [phone]   [facility] [address], [place] [phone], phone; [phone], fax Sees patients 1st and 3rd Saturday of every month.  Must not qualify for public or private insurance (i.e. Medicaid, Medicare, [place] Health Choice, Veterans' Benefits)  Household income should be no more than 200% of the poverty level The clinic cannot treat you if you are pregnant or think you are pregnant  Sexually transmitted diseases are not treated  at the clinic.    Dental Care: Organization         Address  Phone  Notes  [facility] [address] [place], [place] [phone] Accepts children up to age 72 who are enrolled in [place] or [place] Health Choice; pregnant women with a Medicaid card; and children who have applied for Medicaid or [place] Health Choice, but were declined, whose parents can pay a reduced fee at time of service.  [facility]  [address], [place] [phone] Accepts children up to age 37 who are enrolled in [place] or  Health Choice; pregnant women with a Medicaid card; and children who have applied for Medicaid or  Health Choice, but were declined, whose parents can pay a reduced fee at time of service.  [facility]  [address] [place], [place] [phone] Patients are seen by appointment only. Walk-ins are not accepted. [facility] will see patients 75 years of age and older. Monday - Tuesday (8am-5pm) Most Wednesdays  (8:30-5pm) $30 per visit, cash only  [facility]  [address], [facility] [phone] Patients are seen by appointment only. Walk-ins are not accepted. [facility] will see patients 95 years of age and older. One Wednesday Evening (Monthly: Volunteer Based).  $30 per visit, cash only  [employer]  [phone] for adults; Children under age 49, call Graduate Pediatric Dentistry at [phone]. Children aged 85-14, please call [phone] to request a pediatric application.  Dental services are provided in all areas of dental care including fillings, crowns and bridges, complete and partial dentures, implants, gum treatment, root canals, and extractions. Preventive care is also provided. Treatment is provided to both adults and children. Patients are selected via a lottery and there is often a waiting list.   [facility] [address], [place]  [phone] [email or web address]   [facility] [address] [place], [place] [phone], Ext. [phone] Second and Fourth Thursday of each month, opens at 6:30 AM; Clinic ends at 9 AM.  Patients are seen on a first-come first-served basis, and a limited number are seen during each clinic.   [facility]  [address] Ether Griffins [place], [place] [phone]   Eligibility Requirements You must have lived in [place], [place], or [place] counties for at least the last three months.   You cannot be eligible for state or federal sponsored [place], including [employer], [place], or [employer].   You generally cannot be eligible for healthcare insurance through your employer.    How to apply: Eligibility screenings are held every Tuesday and Wednesday afternoon from 1:00 pm until 4:00 pm. You do not need an appointment for the interview!  [facility] [address] [place], [place] [phone]   [facility]  Health Department  [phone]   [facility] Health Department  [phone]   [facility] Health Department  [phone]    Behavioral Health Resources in the Community: Intensive Outpatient Programs Organization         Address  Phone  Notes  [facility] [address] [place], [place] [phone]   [facility] Outpatient [address] [place], [place] [phone]   [facility] [address], [place], [place]  [phone]   [facility]  Mental Health [address],  [place], [place] [phone] or [phone]   Substance Abuse Resources Organization         Address  Phone  Notes  [facility]  [phone]   [facility]  [phone]   [facility]  [phone]   [facility]  [phone]   Residential & Outpatient Substance Abuse Program  [phone]   Psychological Services Organization         Address  Phone  Notes  [facility]  [phone]- [phone]   [facility]  [phone]- [phone]   [facility] [address], [place] [phone] or [phone]    Mobile Crisis Teams Organization         Address  Phone  Notes  [facility], Mobile Crisis Care Unit  [phone]   [facility]  [address] [place], [place] [phone]   Doristine LocksSharon DeEsch [address] [place] [phone]    Self-Help/Support Groups Organization         Address  Phone             Notes  [facility] [place] - variety of support groups  [phone] Call for more information  Narcotics Anonymous (NA), [facility] [address], [place]  2 meetings at this location   Statisticianesidential Treatment Programs Organization         Address  Phone  Notes  [facility] [address],    [place]  [phone]   [facility]  [address], [place] [id number], [place], [place]  [phone]   [facility] [address], [place] [phone] Admissions: 8am-3pm M-F  [facility] [address]    [place], [place] [phone]   [facility] [address], [place], [place] [phone]   [facility] [address]  [place], [place] [phone]   [facility] - Intensive Outpatient [address], [place], [place] [phone]   [facility] ([facility] [address]  [place], [place] [phone] or [phone]   [facility] ([facility]) [address] [place], [place] [phone] Accepts Medicaid  [facility] [address]  [place] [phone] Substance Abuse/Addiction Treatment   [facility] Behavioral Health Resources Organization         Address  Phone  Notes  [facility]  [phone]) [phone]   Angie FavaJulie Brannon, PhD [address] [place], [place]   [phone] or [phone]   [facility]   [address] [place], [place] [phone]   [facility] [address], [place], [place] [phone] Insurance/Medicaid/sponsorship through [facility]  [facility] [address]                                    [place], [place] [phone] Therapy/tele-psych/case  [facility] [address]   [place], [place] [phone](336) [phone]    Dr. Lolly MustacheArfeen  [phone](336) [phone]   [facility]  [employer] [facility] 1) [address],  2) [address], [place] 3)  [address], [place] [phone]  [phone](336) [phone]   [facility] Child Abuse Hotline [phone] or [phone] (After Hours)

## 2014-06-01 NOTE — ED Notes (Signed)
Pt in restroom 

## 2014-06-01 NOTE — ED Notes (Signed)
Pt states she has vaginal irritation and redness; states tubes tied [facility]. No insurance. States she thinks she has yeast infections. Has acid reflux and thinks she also has belly pain but due to that.

## 2014-06-01 NOTE — ED Provider Notes (Signed)
  Medical screening examination/treatment/procedure(s) were performed by non-physician practitioner and as supervising physician I was immediately available for consultation/collaboration.   EKG Interpretation None         Gerhard Munchobert Lily Velasquez, MD 06/01/14 1728

## 2014-06-01 NOTE — ED Provider Notes (Signed)
CSN: [id number]     Arrival date & time 06/01/14  [id number] History   First MD Initiated Contact with Patient 06/01/14 [phone]     Chief Complaint  Patient presents with  . Vaginal Pain     (Consider location/radiation/quality/duration/timing/severity/associated sxs/prior Treatment) HPI Ashley Ford is a 38 y.o. female with no significant past medical history comes in for evaluation of vaginal irritation. Patient states approximately 3 days ago began to have swelling in her vagina and reports the skin is peeling. She has been taking topical metronidazole the previous 2 days without relief, she did not use it today because she knew she was coming to the emergency room for evaluation. Last intercourse was one week ago she did use a condom. She denies any new feminine hygiene products, soaps, perfumes, sexual toys, creams or lotions. Her last menstrual period began September 13. She also reports associated dysuria without hematuria. She denies fevers, abdominal pain, nausea vomiting constipation or diarrhea, no numbness or weakness.  Past Medical History  Diagnosis Date  . Anemia   . Depression   . GERD (gastroesophageal reflux disease)    Past Surgical History  Procedure Laterality Date  . Other surgical history      fibroids removed from breasts  . Tubal ligation     Family History  Problem Relation Age of Onset  . Anesthesia problems Neg Hx   . Hypotension Neg Hx   . Malignant hyperthermia Neg Hx   . Pseudochol deficiency Neg Hx    History  Substance Use Topics  . Smoking status: Current Every Day Smoker -- 0.25 packs/day    Types: Cigarettes  . Smokeless tobacco: Never Used  . Alcohol Use: No   OB History   Grav Para Term Preterm Abortions TAB SAB Ect Mult Living   3 2 2  1  1   2      Review of Systems  Constitutional: Negative for fever.  HENT: Negative for sore throat.   Eyes: Negative for visual disturbance.  Respiratory: Negative for shortness of breath.    Cardiovascular: Negative for chest pain.  Gastrointestinal: Negative for abdominal pain.  Endocrine: Negative for polyuria.  Genitourinary: Positive for vaginal discharge and vaginal pain. Negative for dysuria and vaginal bleeding.  Skin: Negative for rash.  Neurological: Negative for headaches.      Allergies  Penicillins  Home Medications   Prior to Admission medications   Medication Sig Start Date End Date Taking? Authorizing Provider  metroNIDAZOLE (METROCREAM) 0.75 % cream Apply 1 application topically once.   Yes Historical Provider, MD   BP 129/80  Pulse 84  Temp(Src) 98.7 F (37.1 C) (Oral)  Resp 18  Ht 5\' 10"  (1.778 m)  Wt 225 lb (102.059 kg)  BMI 32.28 kg/m2  SpO2 100%  LMP 05/01/2014 Physical Exam  Nursing note and vitals reviewed. Constitutional: She is oriented to person, place, and time. She appears well-developed and well-nourished.  HENT:  Head: Normocephalic and atraumatic.  Mouth/Throat: Oropharynx is clear and moist.  Eyes: Conjunctivae are normal. Pupils are equal, round, and reactive to light. Right eye exhibits no discharge. Left eye exhibits no discharge. No scleral icterus.  Neck: Neck supple.  Cardiovascular: Normal rate, regular rhythm and normal heart sounds.   Pulmonary/Chest: Effort normal and breath sounds normal. No respiratory distress. She has no wheezes. She has no rales.  Abdominal: Soft. There is no tenderness.  Genitourinary:  Chaperone was present for the entire genital exam. No lesions or rashes appreciated on  vulva. Cervix visualized on speculum exam and appropriate cultures sampled. No blood in vaginal vault. Curdy white discharge Upon bi manual exam- No TTP of the adnexa, no cervical motion tenderness. No fullness or masses appreciated. No abnormalities appreciated in structural anatomy.   Musculoskeletal: She exhibits no tenderness.  Neurological: She is alert and oriented to person, place, and time.  Cranial Nerves II-XII  grossly intact  Skin: Skin is warm and dry. No rash noted.  Psychiatric: She has a normal mood and affect.    ED Course  Procedures (including critical care time) Labs Review Labs Reviewed  WET PREP, GENITAL  GC/CHLAMYDIA PROBE AMP  URINALYSIS, ROUTINE W REFLEX MICROSCOPIC  PREGNANCY, URINE  RPR  HIV ANTIBODY (ROUTINE TESTING)    Imaging Review No results found.   EKG Interpretation None     Meds given in ED:  Medications - No data to display  New Prescriptions   No medications on file   Filed Vitals:   06/01/14 0914  BP: 129/80  Pulse: 84  Temp: 98.7 F (37.1 C)  TempSrc: Oral  Resp: 18  Height: 5\' 10"  (1.778 m)  Weight: 225 lb (102.059 kg)  SpO2: 100%    MDM  Vitals stable - WNL -afebrile Pt resting comfortably in ED. PE shows no sign of acute or emergent pathology. Pelvic exam consistent with yeast infection will treat with Diflucan. Recommend appropriate followup with primary care or women's center. Discussed symptomatic treatment for vaginal pain. Cool compresses, 100% cotton underwear and pads, avoid tight fitting clothing, avoidance of new or different feminine products, perfumes. Labwork essentially normal and noncontributory. No evidence of UTI.  Will DC with Discussed f[facility] and return precautions, pt very amenable to plan. Patient stable, in good condition and is appropriate for discharge  Prior to patient discharge, I discussed and reviewed this case with Dr.Lockwood    Final diagnoses:  Vaginal yeast infection        Ashley MottsBenjamin W Jenene Kauffmann, PA-C 06/01/14 1247

## 2014-06-01 NOTE — Discharge Planning (Signed)
[facility] Community Health & Eligibility Specialist ° °Spoke to the patient regarding primary care resources and the [facility] orange card. Orange card application and instructions provided. Resource guide and my contact information given for any future questions or concerns. No other Community Health & Eligibility Specialist needs identified at this time.  °

## 2014-06-02 LAB — GC/CHLAMYDIA PROBE AMP
CT PROBE, AMP APTIMA: NEGATIVE
GC Probe RNA: NEGATIVE

## 2014-06-21 ENCOUNTER — Encounter (HOSPITAL_COMMUNITY): Payer: Self-pay | Admitting: Emergency Medicine

## 2016-11-12 ENCOUNTER — Ambulatory Visit (HOSPITAL_COMMUNITY)
Admission: EM | Admit: 2016-11-12 | Discharge: 2016-11-12 | Disposition: A | Discharge status: Home / Self Care | Payer: Self-pay | Attending: Internal Medicine | Admitting: Internal Medicine

## 2016-11-12 ENCOUNTER — Encounter (HOSPITAL_COMMUNITY): Payer: Self-pay | Admitting: Emergency Medicine

## 2016-11-12 DIAGNOSIS — L2082 Flexural eczema: Secondary | ICD-10-CM

## 2016-11-12 DIAGNOSIS — R21 Rash and other nonspecific skin eruption: Secondary | ICD-10-CM

## 2016-11-12 MED ORDER — TRIAMCINOLONE ACETONIDE 0.1 % EX CREA
1.0000 | TOPICAL_CREAM | Freq: Two times a day (BID) | CUTANEOUS | 0 refills | Status: DC
Start: 2016-11-12 — End: 2017-01-04

## 2016-11-12 NOTE — ED Triage Notes (Signed)
[facility] a [facility] of a rash on both hands that has been recurrent from last year.

## 2016-11-12 NOTE — ED Provider Notes (Signed)
CSN: [id number]     Arrival date & time 11/12/16  1357 History   First MD Initiated Contact with Patient 11/12/16 1504     Chief Complaint  Patient presents with  . Rash   (Consider location/radiation/quality/duration/timing/severity/associated sxs/prior Treatment) HPI  Ashley Ford is a 41 y.o. female presenting to UC with c/o dry, itching, flaking rash on palms of both hands. Hx of similar rash about 1 year ago but she notes that eventually resolved on its own.  She has tried OTC creams but no relief. She notes she has recently been using latex gloves at work and is concerned that is what caused current symptoms.  No other rashes.    Past Medical History:  Diagnosis Date  . Anemia   . Depression   . GERD (gastroesophageal reflux disease)    Past Surgical History:  Procedure Laterality Date  . OTHER SURGICAL HISTORY     fibroids removed from breasts  . TUBAL LIGATION     Family History  Problem Relation Age of Onset  . Anesthesia problems Neg Hx   . Hypotension Neg Hx   . Malignant hyperthermia Neg Hx   . Pseudochol deficiency Neg Hx    Social History  Substance Use Topics  . Smoking status: Current Every Day Smoker    Packs/day: 0.25    Types: Cigarettes  . Smokeless tobacco: Never Used  . Alcohol use No   OB History    Gravida Para Term Preterm AB Living   3 2 2   1 2    SAB TAB Ectopic Multiple Live Births   1             Review of Systems  Musculoskeletal: Negative for arthralgias, joint swelling and myalgias.  Skin: Positive for rash. Negative for color change and wound.    Allergies  Penicillins  Home Medications   Prior to Admission medications   Medication Sig Start Date End Date Taking? Authorizing Provider  triamcinolone cream (KENALOG) 0.1 % Apply 1 application topically 2 (two) times daily. 11/12/16   Junius Finner, PA-C   Meds Ordered and Administered this Visit  Medications - No data to display  BP 124/73 (BP Location: Right Arm)   Pulse 86    Temp 99 F (37.2 C) (Oral)   Resp 18   SpO2 100%  No data found.   Physical Exam  Constitutional: She is oriented to person, place, and time. She appears well-developed and well-nourished. No distress.  HENT:  Head: Normocephalic and atraumatic.  Eyes: EOM are normal.  Neck: Normal range of motion.  Cardiovascular: Normal rate.   Pulmonary/Chest: Effort normal.  Musculoskeletal: Normal range of motion.  Neurological: She is alert and oriented to person, place, and time.  Skin: Skin is warm and dry. Rash noted. She is not diaphoretic.  Bilateral palms: dried flaking skin with faint underlying erythema. Non-tender. No bleeding or drainage.   Psychiatric: She has a normal mood and affect. Her behavior is normal.  Nursing note and vitals reviewed.   Urgent Care Course     Procedures (including critical care time)  Labs Review Labs Reviewed - No data to display  Imaging Review No results found.   MDM   1. Rash and nonspecific skin eruption   2. Flexural eczema    Questionable contact dermatitis from reported use of latex gloves.  Rash looks c/w eczema  Rx: triamcinolone cream f/u with PCP or dermatologist in 1-2 weeks if not improving.  Junius Finnerrin O'Malley, PA-C 11/12/16 1620

## 2016-11-12 NOTE — Discharge Instructions (Signed)
°  Try to avoid use of latex gloves if you think this is the cause of your hand rash.  You may try the triamcinolone cream to help with itching and hand dryness.    If not improving in 1-2 weeks, or if symptoms worsening, you should follow up with a primary care provider or a dermatologist as you may need to have a skin biopsy to help determine what is causing the skin rash/dryness.

## 2017-01-04 ENCOUNTER — Encounter: Payer: Self-pay | Admitting: Internal Medicine

## 2017-01-04 ENCOUNTER — Ambulatory Visit: Payer: Self-pay | Attending: Internal Medicine | Admitting: Internal Medicine

## 2017-01-04 VITALS — BP 116/78 | HR 88 | Temp 98.6°F | Resp 16 | Ht 70.0 in | Wt 234.2 lb

## 2017-01-04 DIAGNOSIS — L309 Dermatitis, unspecified: Secondary | ICD-10-CM

## 2017-01-04 DIAGNOSIS — F17200 Nicotine dependence, unspecified, uncomplicated: Secondary | ICD-10-CM | POA: Insufficient documentation

## 2017-01-04 DIAGNOSIS — R21 Rash and other nonspecific skin eruption: Secondary | ICD-10-CM | POA: Insufficient documentation

## 2017-01-04 DIAGNOSIS — Z880 Allergy status to penicillin: Secondary | ICD-10-CM | POA: Insufficient documentation

## 2017-01-04 DIAGNOSIS — F411 Generalized anxiety disorder: Secondary | ICD-10-CM | POA: Insufficient documentation

## 2017-01-04 DIAGNOSIS — F17210 Nicotine dependence, cigarettes, uncomplicated: Secondary | ICD-10-CM | POA: Insufficient documentation

## 2017-01-04 HISTORY — DX: Nicotine dependence, unspecified, uncomplicated: F17.200

## 2017-01-04 MED ORDER — TRIAMCINOLONE ACETONIDE 0.1 % EX CREA: 1.0000 "application " | TOPICAL_CREAM | Freq: Two times a day (BID) | CUTANEOUS | 1 refills | Status: DC

## 2017-01-04 MED FILL — ?TRIAMCINOLONE 0.1% CREAM: 0.1 | 15 days supply | Qty: 30 | Fill #0

## 2017-01-04 NOTE — Patient Instructions (Signed)
Steps to Quit Smoking Smoking tobacco can be bad for your health. It can also affect almost every organ in your body. Smoking puts you and people around you at risk for many serious long-lasting (chronic) diseases. Quitting smoking is hard, but it is one of the best things that you can do for your health. It is never too late to quit. What are the benefits of quitting smoking? When you quit smoking, you lower your risk for getting serious diseases and conditions. They can include:  Lung cancer or lung disease.  Heart disease.  Stroke.  Heart attack.  Not being able to have children (infertility).  Weak bones (osteoporosis) and broken bones (fractures). If you have coughing, wheezing, and shortness of breath, those symptoms may get better when you quit. You may also get sick less often. If you are pregnant, quitting smoking can help to lower your chances of having a baby of low birth weight. What can I do to help me quit smoking? Talk with your doctor about what can help you quit smoking. Some things you can do (strategies) include:  Quitting smoking totally, instead of slowly cutting back how much you smoke over a period of time.  Going to in-person counseling. You are more likely to quit if you go to many counseling sessions.  Using resources and support systems, such as:  Online chats with a counselor.  Phone quitlines.  Printed self-help materials.  Support groups or group counseling.  Text messaging programs.  Mobile phone apps or applications.  Taking medicines. Some of these medicines may have nicotine in them. If you are pregnant or breastfeeding, do not take any medicines to quit smoking unless your doctor says it is okay. Talk with your doctor about counseling or other things that can help you. Talk with your doctor about using more than one strategy at the same time, such as taking medicines while you are also going to in-person counseling. This can help make quitting  easier. What things can I do to make it easier to quit? Quitting smoking might feel very hard at first, but there is a lot that you can do to make it easier. Take these steps:  Talk to your family and friends. Ask them to support and encourage you.  Call phone quitlines, reach out to support groups, or work with a counselor.  Ask people who smoke to not smoke around you.  Avoid places that make you want (trigger) to smoke, such as:  Bars.  Parties.  Smoke-break areas at work.  Spend time with people who do not smoke.  Lower the stress in your life. Stress can make you want to smoke. Try these things to help your stress:  Getting regular exercise.  Deep-breathing exercises.  Yoga.  Meditating.  Doing a body scan. To do this, close your eyes, focus on one area of your body at a time from head to toe, and notice which parts of your body are tense. Try to relax the muscles in those areas.  Download or buy apps on your mobile phone or tablet that can help you stick to your quit plan. There are many free apps, such as QuitGuide from the CDC (Centers for Disease Control and Prevention). You can find more support from smokefree.gov and other websites. This information is not intended to replace advice given to you by your health care provider. Make sure you discuss any questions you have with your health care provider. Document Released: 06/02/2009 Document Revised: 04/03/2016 Document   Reviewed: 12/21/2014 Elsevier Interactive Patient Education  2017 [employer]  Eczema Eczema, also called atopic dermatitis, is a skin disorder that causes inflammation of the skin. It causes a red rash and dry, scaly skin. The skin becomes very itchy. Eczema is generally worse during the cooler winter months and often improves with the warmth of summer. Eczema usually starts showing signs in infancy. Some children outgrow eczema, but it may last through adulthood. What are the causes? The exact  cause of eczema is not known, but it appears to run in families. People with eczema often have a family history of eczema, allergies, asthma, or hay fever. Eczema is not contagious. Flare-ups of the condition may be caused by:  Contact with something you are sensitive or allergic to.  Stress. What are the signs or symptoms?  Dry, scaly skin.  Red, itchy rash.  Itchiness. This may occur before the skin rash and may be very intense. How is this diagnosed? The diagnosis of eczema is usually made based on symptoms and medical history. How is this treated? Eczema cannot be cured, but symptoms usually can be controlled with treatment and other strategies. A treatment plan might include:  Controlling the itching and scratching.  Use over-the-counter antihistamines as directed for itching. This is especially useful at night when the itching tends to be worse.  Use over-the-counter steroid creams as directed for itching.  Avoid scratching. Scratching makes the rash and itching worse. It may also result in a skin infection (impetigo) due to a break in the skin caused by scratching.  Keeping the skin well moisturized with creams every day. This will seal in moisture and help prevent dryness. Lotions that contain alcohol and water should be avoided because they can dry the skin.  Limiting exposure to things that you are sensitive or allergic to (allergens).  Recognizing situations that cause stress.  Developing a plan to manage stress. Follow these instructions at home:  Only take over-the-counter or prescription medicines as directed by your health care provider.  Do not use anything on the skin without checking with your health care provider.  Keep baths or showers short (5 minutes) in warm (not hot) water. Use mild cleansers for bathing. These should be unscented. You may add nonperfumed bath oil to the bath water. It is best to avoid soap and bubble bath.  Immediately after a bath or  shower, when the skin is still damp, apply a moisturizing ointment to the entire body. This ointment should be a petroleum ointment. This will seal in moisture and help prevent dryness. The thicker the ointment, the better. These should be unscented.  Keep fingernails cut short. Children with eczema may need to wear soft gloves or mittens at night after applying an ointment.  Dress in clothes made of cotton or cotton blends. Dress lightly, because heat increases itching.  A child with eczema should stay away from anyone with fever blisters or cold sores. The virus that causes fever blisters (herpes simplex) can cause a serious skin infection in children with eczema. Contact a health care provider if:  Your itching interferes with sleep.  Your rash gets worse or is not better within 1 week after starting treatment.  You see pus or soft yellow scabs in the rash area.  You have a fever.  You have a rash flare-up after contact with someone who has fever blisters. This information is not intended to replace advice given to you by your health care provider. Make sure you  discuss any questions you have with your health care provider. Document Released: 08/03/2000 Document Revised: 01/12/2016 Document Reviewed: 03/09/2013 Elsevier Interactive Patient Education  2017 [employer].

## 2017-01-04 NOTE — Progress Notes (Signed)
Patient ID: Ashley Ford, female    DOB: [birth year]  MRN: [id number]  CC: New Patient (Initial Visit) (Establish Care) and Rash (Bumps - Hands-B x 3 mths )   Subjective: Ashley Ford is a 41 y.o. female who presents for new pt Her concerns today include:  1. C/o peeling rash on palms x 3 mths.  l -started after she worked in a garden last yr.  Presented as" little bumps that were coming and going." Bumps would rupture then skin would peel -now more extensive and itching -uses regular house hold cleaners.  Not bothered by dish detergent.   --irritated by latex gloves -[facility] and was given Triamcinolone cream which helped.  "At least it is not peeling as bad." It also helped decreased the itching.   Used it BID for first 2 wks but now a few times a wk.    2.  Tobacco: 3-4 cigarettes a day since age 89.  Quit for 2 yrs (2009-2011) but restarted due to stress. -wanting to quit again.  Feels once she starts working again and less time on her hands, she will be able to quit   No current outpatient prescriptions on file prior to visit.   No current facility-administered medications on file prior to visit.     Allergies  Allergen Reactions  . Penicillins Other (See Comments)    Swelling and hives    Social History   Social History  . Marital status: Married    Spouse name: N/A  . Number of children: 2  . Years of education: 13   Occupational History  . unemployed    Social History Main Topics  . Smoking status: Current Every Day Smoker    Packs/day: 0.25    Years: 20.00    Types: Cigarettes  . Smokeless tobacco: Never Used  . Alcohol use Yes     Comment: Socially  . Drug use: Yes    Types: Marijuana     Comment: Socially  . Sexual activity: Yes    Partners: Male    Birth control/ protection: Surgical   Other Topics Concern  . Not on file   Social History Narrative  . No narrative on file    Family History  Problem Relation Age of Onset  .  Diabetes Mother   . Hypertension Mother   . Breast cancer Mother   . Breast cancer Maternal Grandmother   . Anesthesia problems Neg Hx   . Hypotension Neg Hx   . Malignant hyperthermia Neg Hx   . Pseudochol deficiency Neg Hx     Past Surgical History:  Procedure Laterality Date  . OTHER SURGICAL HISTORY     fibroids removed from breasts  . TUBAL LIGATION      ROS: Review of Systems  Constitutional: Negative for activity change and appetite change.  Skin:       +itching of palms    PHYSICAL EXAM: BP 116/78 (BP Location: Right Arm, Patient Position: Sitting, Cuff Size: Large)   Pulse 88   Temp 98.6 F (37 C) (Oral)   Resp 16   Ht 5\' 10"  (1.778 m)   Wt 234 lb 3.2 oz (106.2 kg)   LMP 12/03/2016 (Approximate)   SpO2 99%   BMI 33.60 kg/m   Physical Exam  Constitutional:  Well-appearing, overweight, young African-American female in NAD  Skin:  Hands: Increase thickness and mild hyperpigmentation with peeling on the ball of the left hand.  Less extensive on  the palm of the right hand    Depression screen [facility] 2/9 01/04/2017  Decreased Interest 0  Down, Depressed, Hopeless 0  PHQ - 2 Score 0  Altered sleeping 0  Tired, decreased energy 0  Change in appetite 0  Feeling bad or failure about yourself  0  Trouble concentrating 0  Moving slowly or fidgety/restless 0  Suicidal thoughts 0  PHQ-9 Score 0   GAD 7 : Generalized Anxiety Score 01/04/2017  Nervous, Anxious, on Edge 0  Control/stop worrying 1  Worry too much - different things 1  Trouble relaxing 1  Restless 2  Easily annoyed or irritable 1  Afraid - awful might happen 1  Total GAD 7 Score 7  Anxiety Difficulty Somewhat difficult    ASSESSMENT AND PLAN: 1. Eczema of both hands -Advised to use the triamcinolone cream twice a day until completely resolves and use as needed. -Avoid using latex gloves as patient reports rash was made worse when she did wear latex gloves.  2. Tobacco dependence Advised to  quit. Patient wanting to quit. We discussed smoking cessation methods including an NRT, Chantix or bupropion. Patient not interested in any of these products. She feels that she can quit cold [place] and will try to do so. Patient informed of the one 800 Quit Now line for coaching and was given printed information about smoking cessation.  Will address GAD on f/u visit.   Patient was given the opportunity to ask questions.  Patient verbalized understanding of the plan and was able to repeat key elements of the plan.   No orders of the defined types were placed in this encounter.    Requested Prescriptions   Signed Prescriptions Disp Refills  . triamcinolone cream (KENALOG) 0.1 % 30 g 1    Sig: Apply 1 application topically 2 (two) times daily.    Return in about 6 weeks (around 02/15/2017) for physical/PAP.  Jonah Blue, MD, FACP

## 2017-02-15 ENCOUNTER — Encounter: Payer: Self-pay | Admitting: Internal Medicine

## 2017-02-15 ENCOUNTER — Ambulatory Visit: Payer: Self-pay | Attending: Internal Medicine | Admitting: Internal Medicine

## 2017-02-15 VITALS — BP 109/73 | HR 67 | Temp 98.6°F | Resp 16 | Wt 234.8 lb

## 2017-02-15 DIAGNOSIS — Z23 Encounter for immunization: Secondary | ICD-10-CM

## 2017-02-15 DIAGNOSIS — Z0000 Encounter for general adult medical examination without abnormal findings: Secondary | ICD-10-CM | POA: Insufficient documentation

## 2017-02-15 DIAGNOSIS — R7303 Prediabetes: Secondary | ICD-10-CM | POA: Insufficient documentation

## 2017-02-15 DIAGNOSIS — E66811 Obesity, class 1: Secondary | ICD-10-CM | POA: Insufficient documentation

## 2017-02-15 DIAGNOSIS — Z6833 Body mass index (BMI) 33.0-33.9, adult: Secondary | ICD-10-CM | POA: Insufficient documentation

## 2017-02-15 DIAGNOSIS — Z114 Encounter for screening for human immunodeficiency virus [HIV]: Secondary | ICD-10-CM

## 2017-02-15 DIAGNOSIS — E049 Nontoxic goiter, unspecified: Secondary | ICD-10-CM

## 2017-02-15 DIAGNOSIS — F17210 Nicotine dependence, cigarettes, uncomplicated: Secondary | ICD-10-CM | POA: Insufficient documentation

## 2017-02-15 DIAGNOSIS — E669 Obesity, unspecified: Secondary | ICD-10-CM | POA: Insufficient documentation

## 2017-02-15 DIAGNOSIS — F17200 Nicotine dependence, unspecified, uncomplicated: Secondary | ICD-10-CM

## 2017-02-15 HISTORY — DX: Obesity, class 1: E66.811

## 2017-02-15 HISTORY — DX: Prediabetes: R73.03

## 2017-02-15 LAB — POCT GLYCOSYLATED HEMOGLOBIN (HGB A1C): HEMOGLOBIN A1C: 5.8

## 2017-02-15 NOTE — Progress Notes (Signed)
Patient ID: Ashley KidneyLiza N Ford, female    DOB: June 29, 1976  MRN: [id number]  CC: Gynecologic Exam and Annual Exam   Subjective: Ashley Ford is a 41 y.o. female who presents for physical Her concerns today include:  1. Tobacco dep: she has decreased to 1-2 cig a day from 3-4. Has a new grandson in the house.  2. Due for PAP G3P2 (miscarriage) Last pap was 3 yrs ago. No abnormals. -treated for Chlamydia 15 yrs ago -sexually active with 2 female partners. Uses condoms with one. -some irritation before last period but not now. -had tubal ligation -LNMP was last wk. Menses regular lasting 4-5 days. -fhx of breast CA in mom and maternal GM. Mom dx at age 39.   -checks breast regularly. Had lumps removed from both breast in past which turned out to be fibrocyst  3. Obesity: -drinks a lot of sweet tea. -does stretching exercises and walk daily for 10-15 mins. 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    Patient Active Problem List   Diagnosis Date Noted  . Prediabetes 02/15/2017  . Obesity (BMI 30.0-34.9) 02/15/2017  . Tobacco dependence 01/04/2017     Current Outpatient Prescriptions on File Prior to Visit  Medication Sig Dispense Refill  . triamcinolone cream (KENALOG) 0.1 % Apply 1 application topically 2 (two) times daily. 30 g 1   No current facility-administered medications on file prior to visit.     Allergies  Allergen Reactions  . Penicillins Other (See Comments)    Swelling and hives    Social History   Social History  . Marital status: Married    Spouse name: N/A  . Number of children: 2  . Years of education: 13   Occupational History  . unemployed    Social History Main Topics  . Smoking status: Current Every Day Smoker    Packs/day: 0.25    Years: 20.00    Types: Cigarettes  . Smokeless tobacco: Never Used  . Alcohol use Yes     Comment: Socially  . Drug use: Yes    Types: Marijuana     Comment: Socially  . Sexual  activity: Yes    Partners: Male    Birth control/ protection: Surgical   Other Topics Concern  . Not on file   Social History Narrative  . No narrative on file    Family History  Problem Relation Age of Onset  . Diabetes Mother   . Hypertension Mother   . Breast cancer Mother   . Breast cancer Maternal Grandmother   . Anesthesia problems Neg Hx   . Hypotension Neg Hx   . Malignant hyperthermia Neg Hx   . Pseudochol deficiency Neg Hx     Past Surgical History:  Procedure Laterality Date  . OTHER SURGICAL HISTORY     fibroids removed from breasts  . TUBAL LIGATION      ROS: Review of Systems  Constitutional: Negative for activity change and appetite change.  Eyes: Negative for visual disturbance.  Respiratory: Negative for cough and shortness of breath.   Cardiovascular: Negative for chest pain.  Gastrointestinal: Negative for abdominal pain.    PHYSICAL EXAM: BP 109/73   Pulse 67   Temp 98.6 F (37 C) (Oral)   Resp 16   Wt 234 lb 12.8 oz (106.5 kg)   SpO2 96%   BMI 33.69 kg/m   Wt Readings from Last 3 Encounters:  02/15/17 234 lb 12.8 oz (106.5  kg)  01/04/17 234 lb 3.2 oz (106.2 kg)  06/01/14 225 lb (102.1 kg)    Physical Exam  General appearance - alert, well appearing, obese AAF and in no distress Mental status - alert, oriented to person, place, and time, normal mood, behavior, speech, dress, motor activity, and thought processes Eyes - pupils equal and reactive, extraocular eye movements intact Ears - small amount wax build up in RT ear Nose - normal and patent, no erythema, discharge or polyps Mouth - mucous membranes moist, pharynx normal without lesions Neck - supple, no significant adenopathy. Slight thyroid enlargement without nodules Lymphatics - no palpable lymphadenopathy, no hepatosplenomegaly Chest - clear to auscultation, no wheezes, rales or rhonchi, symmetric air entry Heart - normal rate, regular rhythm, normal S1, S2, no murmurs,  rubs, clicks or gallops Abdomen - soft, nontender, nondistended, no masses or organomegaly Breasts - breasts appear normal, no suspicious masses, no skin or nipple changes or axillary nodes Pelvic - normal external genitalia, vulva, vagina, cervix, uterus and adnexa Neurological - cranial nerves II through XII intact, motor and sensory grossly normal bilaterally Musculoskeletal - no joint tenderness, deformity or swelling Extremities -no LE edema Skin -  A1C: 5.8  Depression screen [facility] 2/9 02/15/2017 01/04/2017  Decreased Interest 0 0  Down, Depressed, Hopeless 0 0  PHQ - 2 Score 0 0  Altered sleeping 1 0  Tired, decreased energy 1 0  Change in appetite 0 0  Feeling bad or failure about yourself  0 0  Trouble concentrating 0 0  Moving slowly or fidgety/restless 0 0  Suicidal thoughts 0 0  PHQ-9 Score 2 0     ASSESSMENT AND PLAN: 1. Visit for annual health examination -safe sex practices encouraged - Cytology - PAP  2. Prediabetes -discussed importance of healthy eating habits and regular exercise to prevent DM. -advised to avoid sweet drinks and cut back on white carbs.  Eat more fresh fruits and veggies.Printed information given on prediabetes - POCT glycosylated hemoglobin (Hb A1C) - CBC - Comprehensive metabolic panel - Lipid panel  3. Obesity (BMI 30.0-34.9) -See #2 above  4. Tobacco dependence Commended her on cutting back even more. She will continue to work on trying to quit  5. Need for Tdap vaccination - Tdap vaccine greater than or equal to 7yo IM  6. Screening for HIV (human immunodeficiency virus) - HIV antibody  7. Thyroid enlarged - TSH+T4F+T3Free  Patient was given the opportunity to ask questions.  Patient verbalized understanding of the plan and was able to repeat key elements of the plan.   Orders Placed This Encounter  Procedures  . Tdap vaccine greater than or equal to 7yo IM  . CBC  . Comprehensive metabolic panel  . Lipid panel  . HIV  antibody  . TSH+T4F+T3Free  . POCT glycosylated hemoglobin (Hb A1C)     Requested Prescriptions    No prescriptions requested or ordered in this encounter    Return if symptoms worsen or fail to improve.  Jonah Blue, MD, FACP

## 2017-02-15 NOTE — Patient Instructions (Signed)
Prediabetes Prediabetes is the condition of having a blood sugar (blood glucose) level that is higher than it should be, but not high enough for you to be diagnosed with type 2 diabetes. Having prediabetes puts you at risk for developing type 2 diabetes (type 2 diabetes mellitus). Prediabetes may be called impaired glucose tolerance or impaired fasting glucose. Prediabetes usually does not cause symptoms. Your health care provider can diagnose this condition with blood tests. You may be tested for prediabetes if you are overweight and if you have at least one other risk factor for prediabetes. Risk factors for prediabetes include:  Having a family member with type 2 diabetes.  Being overweight or obese.  Being older than age 45.  Being of American-Indian, African-American, Hispanic/Latino, or Asian/Pacific Islander descent.  Having an inactive (sedentary) lifestyle.  Having a history of gestational diabetes or polycystic ovarian syndrome (PCOS).  Having low levels of good cholesterol (HDL-C) or high levels of blood fats (triglycerides).  Having high blood pressure.  What is blood glucose and how is blood glucose measured?  Blood glucose refers to the amount of glucose in your bloodstream. Glucose comes from eating foods that contain sugars and starches (carbohydrates) that the body breaks down into glucose. Your blood glucose level may be measured in mg/dL (milligrams per deciliter) or mmol/L (millimoles per liter).Your blood glucose may be checked with one or more of the following blood tests:  A fasting blood glucose (FBG) test. You will not be allowed to eat (you will fast) for at least 8 hours before a blood sample is taken. ? A normal range for FBG is 70-100 mg/dl (3.9-5.6 mmol/L).  An A1c (hemoglobin A1c) blood test. This test provides information about blood glucose control over the previous 2?3months.  An oral glucose tolerance test (OGTT). This test measures your blood  glucose twice: ? After fasting. This is your baseline level. ? Two hours after you drink a beverage that contains glucose.  You may be diagnosed with prediabetes:  If your FBG is 100?125 mg/dL (5.6-6.9 mmol/L).  If your A1c level is 5.7?6.4%.  If your OGGT result is 140?199 mg/dL (7.8-11 mmol/L).  These blood tests may be repeated to confirm your diagnosis. What happens if blood glucose is too high? The pancreas produces a hormone (insulin) that helps move glucose from the bloodstream into cells. When cells in the body do not respond properly to insulin that the body makes (insulin resistance), excess glucose builds up in the blood instead of going into cells. As a result, high blood glucose (hyperglycemia) can develop, which can cause many complications. This is a symptom of prediabetes. What can happen if blood glucose stays higher than normal for a long time? Having high blood glucose for a long time is dangerous. Too much glucose in your blood can damage your nerves and blood vessels. Long-term damage can lead to complications from diabetes, which may include:  Heart disease.  Stroke.  Blindness.  Kidney disease.  Depression.  Poor circulation in the feet and legs, which could lead to surgical removal (amputation) in severe cases.  How can prediabetes be prevented from turning into type 2 diabetes?  To help prevent type 2 diabetes, take the following actions:  Be physically active. ? Do moderate-intensity physical activity for at least 30 minutes on at least 5 days of the week, or as much as told by your health care provider. This could be brisk walking, biking, or water aerobics. ? Ask your health care provider what   activities are safe for you. A mix of physical activities may be best, such as walking, swimming, cycling, and strength training.  Lose weight as told by your health care provider. ? Losing 5-7% of your body weight can reverse insulin resistance. ? Your health  care provider can determine how much weight loss is best for you and can help you lose weight safely.  Follow a healthy meal plan. This includes eating lean proteins, complex carbohydrates, fresh fruits and vegetables, low-fat dairy products, and healthy fats. ? Follow instructions from your health care provider about eating or drinking restrictions. ? Make an appointment to see a diet and nutrition specialist (registered dietitian) to help you create a healthy eating plan that is right for you.  Do not smoke or use any tobacco products, such as cigarettes, chewing tobacco, and e-cigarettes. If you need help quitting, ask your health care provider.  Take over-the-counter and prescription medicines as told by your health care provider. You may be prescribed medicines that help lower the risk of type 2 diabetes.  This information is not intended to replace advice given to you by your health care provider. Make sure you discuss any questions you have with your health care provider. Document Released: 11/28/2015 Document Revised: 01/12/2016 Document Reviewed: 09/27/2015 Elsevier Interactive Patient Education  2018 Elsevier Inc.  

## 2017-02-16 LAB — COMPREHENSIVE METABOLIC PANEL
ALK PHOS: 77 IU/L (ref 39–117)
ALT: 24 IU/L (ref 0–32)
AST: 26 IU/L (ref 0–40)
Albumin/Globulin Ratio: 1.3 (ref 1.2–2.2)
Albumin: 3.9 g/dL (ref 3.5–5.5)
BUN/Creatinine Ratio: 9 (ref 9–23)
BUN: 6 mg/dL (ref 6–20)
CHLORIDE: 106 mmol/L (ref 96–106)
CO2: 23 mmol/L (ref 20–29)
CREATININE: 0.68 mg/dL (ref 0.57–1.00)
Calcium: 8.9 mg/dL (ref 8.7–10.2)
GFR calc Af Amer: 128 mL/min/{1.73_m2} (ref >59)
GFR calc non Af Amer: 111 mL/min/{1.73_m2} (ref >59)
GLUCOSE: 77 mg/dL (ref 65–99)
Globulin, Total: 2.9 g/dL (ref 1.5–4.5)
Potassium: 4.5 mmol/L (ref 3.5–5.2)
Sodium: 141 mmol/L (ref 134–144)
Total Protein: 6.8 g/dL (ref 6.0–8.5)

## 2017-02-16 LAB — CBC
Hematocrit: 35.2 % (ref 34.0–46.6)
Hemoglobin: 11.4 g/dL (ref 11.1–15.9)
MCH: 26.7 pg (ref 26.6–33.0)
MCHC: 32.4 g/dL (ref 31.5–35.7)
MCV: 82 fL (ref 79–97)
PLATELETS: 334 10*3/uL (ref 150–379)
RBC: 4.27 x10E6/uL (ref 3.77–5.28)
RDW: 16.8 % — ABNORMAL HIGH (ref 12.3–15.4)
WBC: 4.9 10*3/uL (ref 3.4–10.8)

## 2017-02-16 LAB — LIPID PANEL
CHOL/HDL RATIO: 2.4 ratio (ref 0.0–4.4)
CHOLESTEROL TOTAL: 144 mg/dL (ref 100–199)
HDL: 61 mg/dL (ref >39)
LDL CALC: 73 mg/dL (ref 0–99)
TRIGLYCERIDES: 52 mg/dL (ref 0–149)
VLDL CHOLESTEROL CAL: 10 mg/dL (ref 5–40)

## 2017-02-16 LAB — TSH+T4F+T3FREE
Free T4: 1.07 ng/dL (ref 0.82–1.77)
T3 FREE: 3.1 pg/mL (ref 2.0–4.4)
TSH: 1.76 u[IU]/mL (ref 0.450–4.500)

## 2017-02-16 LAB — HIV ANTIBODY (ROUTINE TESTING W REFLEX): HIV Screen 4th Generation wRfx: NONREACTIVE

## 2017-02-18 LAB — CERVICOVAGINAL ANCILLARY ONLY
Bacterial vaginitis: POSITIVE — AB
CANDIDA VAGINITIS: NEGATIVE
CHLAMYDIA, DNA PROBE: NEGATIVE
Neisseria Gonorrhea: NEGATIVE
TRICH (WINDOWPATH): NEGATIVE

## 2017-02-19 ENCOUNTER — Ambulatory Visit: Payer: Self-pay

## 2017-02-19 ENCOUNTER — Other Ambulatory Visit: Payer: Self-pay | Admitting: Internal Medicine

## 2017-02-19 LAB — CYTOLOGY - PAP
Diagnosis: NEGATIVE
HPV: NOT DETECTED

## 2017-02-19 MED ORDER — METRONIDAZOLE 500 MG PO TABS: 500.0000 mg | ORAL_TABLET | Freq: Two times a day (BID) | ORAL | 0 refills | Status: DC

## 2017-02-22 ENCOUNTER — Telehealth: Payer: Self-pay | Admitting: Internal Medicine

## 2017-02-22 ENCOUNTER — Telehealth: Payer: Self-pay

## 2017-02-22 NOTE — Telephone Encounter (Signed)
Pt. Returned nurse call and was given results.

## 2017-02-22 NOTE — Telephone Encounter (Signed)
Contacted pt to go over lab and pap results pt didn't answer lvm asking pt to give me a call at her earliest convienence     If pt calls back please give results: pap test was negative. Vaginal cultures for chlamydia, gonorrhea and  trichomonas were negative. She does have bacterial vaginosis which is an infection due to overgrowth of bacteria in the vagina. It is not a STD we do treat it with an antibiotic called Flagyl. I have sent the prescription to pharmacy for pickup.  Also let pt know her blood count, kidney and liver function tests, cholesterol test and thyroid level were normal. HIV test was negative.

## 2017-03-05 MED FILL — metroNIDAZOLE 500 MG TABS: 500 | 7 days supply | Qty: 14 | Fill #0

## 2018-03-04 ENCOUNTER — Other Ambulatory Visit: Payer: Self-pay

## 2018-03-04 ENCOUNTER — Encounter (HOSPITAL_COMMUNITY): Payer: Self-pay | Admitting: Emergency Medicine

## 2018-03-04 ENCOUNTER — Ambulatory Visit (HOSPITAL_COMMUNITY)
Admission: EM | Admit: 2018-03-04 | Discharge: 2018-03-04 | Disposition: A | Discharge status: Home / Self Care | Payer: Self-pay | Attending: Family Medicine | Admitting: Family Medicine

## 2018-03-04 DIAGNOSIS — Z880 Allergy status to penicillin: Secondary | ICD-10-CM | POA: Insufficient documentation

## 2018-03-04 DIAGNOSIS — D649 Anemia, unspecified: Secondary | ICD-10-CM | POA: Insufficient documentation

## 2018-03-04 DIAGNOSIS — F17210 Nicotine dependence, cigarettes, uncomplicated: Secondary | ICD-10-CM | POA: Insufficient documentation

## 2018-03-04 DIAGNOSIS — K219 Gastro-esophageal reflux disease without esophagitis: Secondary | ICD-10-CM | POA: Insufficient documentation

## 2018-03-04 DIAGNOSIS — K029 Dental caries, unspecified: Secondary | ICD-10-CM | POA: Insufficient documentation

## 2018-03-04 DIAGNOSIS — F329 Major depressive disorder, single episode, unspecified: Secondary | ICD-10-CM | POA: Insufficient documentation

## 2018-03-04 DIAGNOSIS — N760 Acute vaginitis: Secondary | ICD-10-CM | POA: Insufficient documentation

## 2018-03-04 LAB — POCT URINALYSIS DIP (DEVICE)
BILIRUBIN URINE: NEGATIVE
Glucose, UA: NEGATIVE mg/dL
HGB URINE DIPSTICK: NEGATIVE
Ketones, ur: NEGATIVE mg/dL
LEUKOCYTES UA: NEGATIVE
NITRITE: NEGATIVE
PH: 6.5 (ref 5.0–8.0)
Protein, ur: NEGATIVE mg/dL
Specific Gravity, Urine: 1.025 (ref 1.005–1.030)
Urobilinogen, UA: 0.2 mg/dL (ref 0.0–1.0)

## 2018-03-04 MED ORDER — CLINDAMYCIN HCL 150 MG PO CAPS: 150.0000 mg | ORAL_CAPSULE | Freq: Four times a day (QID) | ORAL | 0 refills | Status: DC

## 2018-03-04 MED FILL — CLINDAMYCIN HCL 150 MG CAPS: 150 | 7 days supply | Qty: 28 | Fill #0

## 2018-03-04 NOTE — Discharge Instructions (Addendum)
It was nice meeting you!!  We will treat you today for the dental infection.  You can take ibuprofen for pain. I will give you some resources of dental clinics for follow-up. Your urine was negative for infection.  We will check your vaginal swab for bacteria, yeast, STDs. We will call with results.

## 2018-03-04 NOTE — ED Provider Notes (Signed)
[facility]    CSN: [id number] Arrival date & time: 03/04/18  1119     History   Chief Complaint Chief Complaint  Patient presents with  . Dental Pain  . Dysuria    HPI Ashley Ford is a 42 y.o. female.   Pt is a 42 year old female with past medical history, anemia, depression and GERD. She presents today with 3 days of left lower dental pain due to a broken tooth. She is also having swelling to left facial area. She has been taking ibuprofen for the pain with some relief. The pain has been constant and worsening. SHe denies any fever, chill, body aches, headaches.   She is also having 3 days of vaginal itching, burning and slight discharge. She denies any vaginal bleeding, dysuria, hematuria, back pain, abd pain, pelvic pain.   She is currently sexually active with one partner and uses condoms. She has had a tubal. LMP July 5th and normal.   ROS per HPI      Past Medical History:  Diagnosis Date  . Anemia   . Depression   . GERD (gastroesophageal reflux disease)     Patient Active Problem List   Diagnosis Date Noted  . Prediabetes 02/15/2017  . Obesity (BMI 30.0-34.9) 02/15/2017  . Tobacco dependence 01/04/2017    Past Surgical History:  Procedure Laterality Date  . OTHER SURGICAL HISTORY     fibroids removed from breasts  . TUBAL LIGATION      OB History    Gravida  3   Para  2   Term  2   Preterm      AB  1   Living  2     SAB  1   TAB      Ectopic      Multiple      Live Births               Home Medications    Prior to Admission medications   Medication Sig Start Date End Date Taking? Authorizing Provider  clindamycin (CLEOCIN) 150 MG capsule Take 1 capsule (150 mg total) by mouth every 6 (six) hours. 03/04/18   Janace ArisBast, Jaydon Avina A, NP    Family History Family History  Problem Relation Age of Onset  . Diabetes Mother   . Hypertension Mother   . Breast cancer Mother   . Breast cancer Maternal Grandmother   .  Anesthesia problems Neg Hx   . Hypotension Neg Hx   . Malignant hyperthermia Neg Hx   . Pseudochol deficiency Neg Hx     Social History Social History   Tobacco Use  . Smoking status: Current Every Day Smoker    Packs/day: 0.25    Years: 20.00    Pack years: 5.00    Types: Cigarettes  . Smokeless tobacco: Never Used  Substance Use Topics  . Alcohol use: Yes    Comment: Socially  . Drug use: Yes    Types: Marijuana    Comment: Socially     Allergies   Penicillins   Review of Systems Review of Systems   Physical Exam Triage Vital Signs ED Triage Vitals  Enc Vitals Group     BP 03/04/18 1136 113/76     Pulse Rate 03/04/18 1136 80     Resp 03/04/18 1136 16     Temp 03/04/18 1136 99 F (37.2 C)     Temp Source 03/04/18 1136 Oral     SpO2 03/04/18  1136 100 %     Weight --      Height --      Head Circumference --      Peak Flow --      Pain Score 03/04/18 1144 8     Pain Loc --      Pain Edu? --      Excl. in GC? --    No data found.  Updated Vital Signs BP 113/76 (BP Location: Left Arm)   Pulse 80   Temp 99 F (37.2 C) (Oral)   Resp 16   SpO2 100%   Visual Acuity Right Eye Distance:   Left Eye Distance:   Bilateral Distance:    Right Eye Near:   Left Eye Near:    Bilateral Near:     Physical Exam  Constitutional: She is oriented to person, place, and time. She appears well-developed and well-nourished.  HENT:  Head: Normocephalic and atraumatic.  Broken tooth and caries to lateral incisor left lower mouth. Mild swelling to left facial area. Mild erythema to gums. No obvious rashes, lesion.   Neck: Normal range of motion.  Pulmonary/Chest: Effort normal.  Genitourinary:  Genitourinary Comments: Deferred.  Musculoskeletal: Normal range of motion.  Lymphadenopathy:    She has no cervical adenopathy.  Neurological: She is alert and oriented to person, place, and time.  Skin: Skin is warm and dry. Capillary refill takes less than 2 seconds.    Psychiatric: She has a normal mood and affect.     UC Treatments / Results  Labs (all labs ordered are listed, but only abnormal results are displayed) Labs Reviewed  POCT URINALYSIS DIP (DEVICE)  CERVICOVAGINAL ANCILLARY ONLY    EKG None  Radiology No results found.  Procedures Procedures (including critical care time)  Medications Ordered in UC Medications - No data to display  Initial Impression / Assessment and Plan / UC Course  I have reviewed the triage vital signs and the nursing notes.  Pertinent labs & imaging results that were available during my care of the patient were reviewed by me and considered in my medical decision making (see chart for details).     Urine negative for pregnancy or infection. Testing for STDs, BV and yeast with self swab. Will call with results and treat as needed.  Treating the dental infection with clindamycin due to PCN allergy.  Dental resources given.  Ibuprofen for pain.  Final Clinical Impressions(s) / UC Diagnoses   Final diagnoses:  Dental caries  Vaginitis and vulvovaginitis     Discharge Instructions     It was nice meeting you!!  We will treat you today for the dental infection.  You can take ibuprofen for pain. I will give you some resources of dental clinics for follow-up. Your urine was negative for infection.  We will check your vaginal swab for bacteria, yeast, STDs. We will call with results.     ED Prescriptions    Medication Sig Dispense Auth. Provider   clindamycin (CLEOCIN) 150 MG capsule Take 1 capsule (150 mg total) by mouth every 6 (six) hours. 28 capsule Dahlia Byes A, NP     Controlled Substance Prescriptions [place] Controlled Substance Registry consulted? Not Applicable   Janace Aris, NP 03/04/18 [phone]

## 2018-03-04 NOTE — ED Triage Notes (Signed)
[facility].  The patient complained of dental pain and swelling x 3 days.  The patient also complained of dysuria and a strong urinary odor. X 3 days.

## 2018-03-05 LAB — CERVICOVAGINAL ANCILLARY ONLY
Bacterial vaginitis: POSITIVE — AB
Candida vaginitis: POSITIVE — AB
Chlamydia: NEGATIVE
Neisseria Gonorrhea: NEGATIVE
Trichomonas: NEGATIVE

## 2018-03-06 ENCOUNTER — Telehealth (HOSPITAL_COMMUNITY): Payer: Self-pay

## 2018-03-06 MED ORDER — FLUCONAZOLE 150 MG PO TABS: 150.0000 mg | ORAL_TABLET | Freq: Every day | ORAL | 0 refills | Status: AC

## 2018-03-06 NOTE — Telephone Encounter (Signed)
Bacterial Vaginosis test is positive.  Prescription for Clindamycin was given at the urgent care visit.  Test for candida (yeast) was positive.  Prescription for fluconazole 150mg  po now, repeat dose in 3d if needed, #2 no refills, sent to the pharmacy of record.  Recheck or followup with PCP for further evaluation if symptoms are not improving.   Attempted to reach patient.  No answer at this time. Voicemail left.

## 2018-03-11 ENCOUNTER — Telehealth (HOSPITAL_COMMUNITY): Payer: Self-pay

## 2018-03-11 MED ORDER — FLUCONAZOLE 150 MG PO TABS: 150.0000 mg | ORAL_TABLET | Freq: Every day | ORAL | 0 refills | Status: AC

## 2018-03-31 MED FILL — FLUCONAZOLE 150 MG TABS: 150 | 2 days supply | Qty: 2 | Fill #0

## 2019-09-01 ENCOUNTER — Encounter (HOSPITAL_COMMUNITY): Payer: Self-pay

## 2019-09-01 ENCOUNTER — Other Ambulatory Visit: Payer: Self-pay

## 2019-09-01 ENCOUNTER — Ambulatory Visit (INDEPENDENT_AMBULATORY_CARE_PROVIDER_SITE_OTHER): Payer: BC Managed Care – PPO

## 2019-09-01 ENCOUNTER — Ambulatory Visit (HOSPITAL_COMMUNITY)
Admission: EM | Admit: 2019-09-01 | Discharge: 2019-09-01 | Disposition: A | Discharge status: Home / Self Care | Payer: BC Managed Care – PPO | Attending: Urgent Care | Admitting: Urgent Care

## 2019-09-01 DIAGNOSIS — S9031XA Contusion of right foot, initial encounter: Secondary | ICD-10-CM

## 2019-09-01 DIAGNOSIS — M7989 Other specified soft tissue disorders: Secondary | ICD-10-CM

## 2019-09-01 DIAGNOSIS — M79671 Pain in right foot: Secondary | ICD-10-CM | POA: Diagnosis not present

## 2019-09-01 MED ORDER — MELOXICAM 7.5 MG PO TABS: 7.5000 mg | ORAL_TABLET | Freq: Every day | ORAL | 0 refills | Status: DC

## 2019-09-01 NOTE — ED Provider Notes (Signed)
[facility]   MRN: [id number] DOB: 25-Apr-1976  Subjective:   PEGGIE Ford is a 44 y.o. female presenting for 1 day history of acute onset persistent constant right foot pain and swelling from an injury at work on 08/31/2019.  States that she hit her foot up against the palate, tripped over the palate and felt a pop.  Symptoms have been pretty persistent since then.  She has not taken any medications for relief.  Does not like to take medications without diagnosis.  She did not present to work today.  Was advised to come in for a check.  No current facility-administered medications for this encounter.  Current Outpatient Medications:  .  clindamycin (CLEOCIN) 150 MG capsule, Take 1 capsule (150 mg total) by mouth every 6 (six) hours., Disp: 28 capsule, Rfl: 0   Allergies  Allergen Reactions  . Penicillins Other (See Comments)    Swelling and hives    Past Medical History:  Diagnosis Date  . Anemia   . Depression   . GERD (gastroesophageal reflux disease)      Past Surgical History:  Procedure Laterality Date  . OTHER SURGICAL HISTORY     fibroids removed from breasts  . TUBAL LIGATION      Family History  Problem Relation Age of Onset  . Diabetes Mother   . Hypertension Mother   . Breast cancer Mother   . Breast cancer Maternal Grandmother   . Anesthesia problems Neg Hx   . Hypotension Neg Hx   . Malignant hyperthermia Neg Hx   . Pseudochol deficiency Neg Hx     Social History   Tobacco Use  . Smoking status: Current Every Day Smoker    Packs/day: 0.25    Years: 20.00    Pack years: 5.00    Types: Cigarettes  . Smokeless tobacco: Never Used  Substance Use Topics  . Alcohol use: Yes    Comment: Socially  . Drug use: Yes    Types: Marijuana    Comment: Socially    ROS   Objective:   Vitals: BP 137/76 (BP Location: Right Arm)   Pulse 72   Temp 98.2 F (36.8 C) (Oral)   Resp 16   Wt 186 lb (84.4 kg)   LMP 08/04/2019   SpO2 100%   BMI  26.69 kg/m   Physical Exam Constitutional:      General: She is not in acute distress.    Appearance: Normal appearance. She is well-developed. She is not ill-appearing, toxic-appearing or diaphoretic.  HENT:     Head: Normocephalic and atraumatic.     Nose: Nose normal.     Mouth/Throat:     Mouth: Mucous membranes are moist.     Pharynx: Oropharynx is clear.  Eyes:     General: No scleral icterus.    Extraocular Movements: Extraocular movements intact.     Pupils: Pupils are equal, round, and reactive to light.  Cardiovascular:     Rate and Rhythm: Normal rate.  Pulmonary:     Effort: Pulmonary effort is normal.  Skin:    General: Skin is warm and dry.  Neurological:     General: No focal deficit present.     Mental Status: She is alert and oriented to person, place, and time.  Psychiatric:        Mood and Affect: Mood normal.        Behavior: Behavior normal.        Thought Content: Thought content normal.  Judgment: Judgment normal.    DG Foot Complete Right  Result Date: 09/01/2019 CLINICAL DATA:  Right foot pain after fall yesterday. EXAM: RIGHT FOOT COMPLETE - 3+ VIEW COMPARISON:  None. FINDINGS: There is no evidence of fracture or dislocation. There is no evidence of arthropathy or other focal bone abnormality. Soft tissues are unremarkable. IMPRESSION: Negative. Electronically Signed   By: Lupita Raider M.D.   On: 09/01/2019 20:20    Assessment and Plan :   1. Foot pain, right   2. Foot swelling   3. Contusion of right foot, initial encounter     Will use conservative management for foot contusion.  This includes work restrictions, meloxicam.  Patient is to follow-up with occupational health for clearance to return to work. Counseled patient on potential for adverse effects with medications prescribed/recommended today, ER and return-to-clinic precautions discussed, patient verbalized understanding.    Wallis Bamberg, PA-C 09/01/19 2026

## 2019-09-01 NOTE — ED Triage Notes (Signed)
Pt states she felt a pop in her right foot and she tripped over a pallet at work yesterday. Pt  Is still having pain and her toes are numb. ( Pt states her foot is very sore.)

## 2020-06-01 ENCOUNTER — Encounter: Payer: Self-pay | Admitting: Internal Medicine

## 2020-06-08 ENCOUNTER — Telehealth: Payer: Self-pay | Admitting: Internal Medicine

## 2020-06-09 NOTE — Telephone Encounter (Signed)
Contacted pt to go over MM results pt is aware and doesn't have any questions or concerns  

## 2021-04-19 ENCOUNTER — Encounter (HOSPITAL_COMMUNITY): Payer: Self-pay

## 2021-04-19 ENCOUNTER — Other Ambulatory Visit: Payer: Self-pay

## 2021-04-19 ENCOUNTER — Ambulatory Visit (HOSPITAL_COMMUNITY)
Admission: EM | Admit: 2021-04-19 | Discharge: 2021-04-19 | Disposition: A | Discharge status: Home / Self Care | Payer: BC Managed Care – PPO | Attending: Family Medicine | Admitting: Family Medicine

## 2021-04-19 DIAGNOSIS — S46912A Strain of unspecified muscle, fascia and tendon at shoulder and upper arm level, left arm, initial encounter: Secondary | ICD-10-CM

## 2021-04-19 MED ORDER — CYCLOBENZAPRINE HCL 5 MG PO TABS: 5.0000 mg | ORAL_TABLET | Freq: Three times a day (TID) | ORAL | 0 refills | Status: DC | PRN

## 2021-04-19 MED ORDER — DEXAMETHASONE SODIUM PHOSPHATE 10 MG/ML IJ SOLN
10.0000 mg | Freq: Once | INTRAMUSCULAR | Status: AC
  Administered 2021-04-19: 10 mg via INTRAMUSCULAR

## 2021-04-19 MED ORDER — DEXAMETHASONE SODIUM PHOSPHATE 10 MG/ML IJ SOLN
INTRAMUSCULAR | Status: AC
  Filled 2021-04-19: qty 1

## 2021-04-19 NOTE — ED Triage Notes (Signed)
T reports left shoulder pain and states she is not able to move it x 1 week. Denies injury. Naproxen gives relief.

## 2021-04-19 NOTE — ED Provider Notes (Signed)
[facility]    CSN: [id number] Arrival date & time: 04/19/21  0841      History   Chief Complaint Chief Complaint  Patient presents with   Shoulder Pain    HPI Ashley Ford is a 45 y.o. female.   Patient presenting today with 1 week history of acutely worsening left shoulder pain, stiffness.  States the pain is dull, aching and pulling in nature and is worse when she tries to extend the arm, grab an object or lift an object.  She denies radiation of pain down the arm, swelling, discoloration, difficulty breathing, chest pain, palpitations.  She is having some occasional numbness or tingling down into the left hand but states is usually positional.  So far trying over-the-counter pain relievers with minimal relief.  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  Never had anything like this happen in the past.   Past Medical History:  Diagnosis Date   Anemia    Depression    GERD (gastroesophageal reflux disease)     Patient Active Problem List   Diagnosis Date Noted   Prediabetes 02/15/2017   Obesity (BMI 30.0-34.9) 02/15/2017   Tobacco dependence 01/04/2017    Past Surgical History:  Procedure Laterality Date   OTHER SURGICAL HISTORY     fibroids removed from breasts   TUBAL LIGATION      OB History     Gravida  3   Para  2   Term  2   Preterm      AB  1   Living  2      SAB  1   IAB      Ectopic      Multiple      Live Births               Home Medications    Prior to Admission medications   Medication Sig Start Date End Date Taking? Authorizing Provider  cyclobenzaprine (FLEXERIL) 5 MG tablet Take 1 tablet (5 mg total) by mouth 3 (three) times daily as needed for muscle spasms. Do not drink alcohol or drive while taking this medication.  May cause drowsiness. 04/19/21  Yes Particia Nearing, PA-C  meloxicam (MOBIC) 7.5 MG tablet Take 1 tablet (7.5 mg total) by mouth daily.  09/01/19   Wallis Bamberg, PA-C    Family History Family History  Problem Relation Age of Onset   Diabetes Mother    Hypertension Mother    Breast cancer Mother    Breast cancer Maternal Grandmother    Anesthesia problems Neg Hx    Hypotension Neg Hx    Malignant hyperthermia Neg Hx    Pseudochol deficiency Neg Hx     Social History Social History   Tobacco Use   Smoking status: Every Day    Packs/day: 0.25    Years: 20.00    Pack years: 5.00    Types: Cigarettes   Smokeless tobacco: Never  Vaping Use   Vaping Use: Never used  Substance Use Topics   Alcohol use: Yes    Comment: Socially   Drug use: Yes    Types: Marijuana    Comment: Socially     Allergies   Penicillins   Review of Systems Review of Systems Per HPI  Physical Exam Triage Vital Signs ED Triage Vitals  Enc Vitals Group     BP 04/19/21 1001 118/68     Pulse Rate  04/19/21 1001 71     Resp 04/19/21 1001 18     Temp 04/19/21 1001 99.2 F (37.3 C)     Temp Source 04/19/21 1001 Oral     SpO2 04/19/21 1001 96 %     Weight --      Height --      Head Circumference --      Peak Flow --      Pain Score 04/19/21 1000 10     Pain Loc --      Pain Edu? --      Excl. in GC? --    No data found.  Updated Vital Signs BP 118/68 (BP Location: Right Arm)   Pulse 71   Temp 99.2 F (37.3 C) (Oral)   Resp 18   LMP  (Within Weeks) Comment: 2 weeks  SpO2 96%   Visual Acuity Right Eye Distance:   Left Eye Distance:   Bilateral Distance:    Right Eye Near:   Left Eye Near:    Bilateral Near:     Physical Exam Vitals and nursing note reviewed.  Constitutional:      Appearance: Normal appearance. She is not ill-appearing.  HENT:     Head: Atraumatic.  Eyes:     Extraocular Movements: Extraocular movements intact.     Conjunctiva/sclera: Conjunctivae normal.  Cardiovascular:     Rate and Rhythm: Normal rate and regular rhythm.     Pulses: Normal pulses.     Heart sounds: Normal heart  sounds.  Pulmonary:     Effort: Pulmonary effort is normal.     Breath sounds: Normal breath sounds. No wheezing or rales.  Musculoskeletal:        General: Tenderness present. No swelling or deformity. Normal range of motion.     Cervical back: Normal range of motion and neck supple.     Comments: Significantly tender to palpation left anterior and posterior shoulder musculature extending into left lateral pectoral muscle.  Worse with active range of motion against resistance.  Strength full and equal bilateral hands.  Skin:    General: Skin is warm and dry.     Findings: No bruising or erythema.  Neurological:     Mental Status: She is alert and oriented to person, place, and time.     Sensory: No sensory deficit.     Motor: No weakness.     Gait: Gait normal.  Psychiatric:        Mood and Affect: Mood normal.        Thought Content: Thought content normal.        Judgment: Judgment normal.     UC Treatments / Results  Labs (all labs ordered are listed, but only abnormal results are displayed) Labs Reviewed - No data to display  EKG   Radiology No results found.  Procedures Procedures (including critical care time)  Medications Ordered in UC Medications  dexamethasone (DECADRON) injection 10 mg (10 mg Intramuscular Given 04/19/21 1040)    Initial Impression / Assessment and Plan / UC Course  I have reviewed the triage vital signs and the nursing notes.  Pertinent labs & imaging results that were available during my care of the patient were reviewed by me and considered in my medical decision making (see chart for details).     Vital signs reassuring, exam consistent with a muscular strain in the insertion of the pectoral muscle with the left shoulder musculature.  Given her lack of improvement with over-the-counter pain  relievers, will give IM Decadron, low-dose Flexeril with precautions.  Discussed stretches, heat, rest.  Work note given.  Follow-up with PCP or  sports medicine if not fully resolving.  Final Clinical Impressions(s) / UC Diagnoses   Final diagnoses:  Strain of left shoulder, initial encounter   Discharge Instructions   None    ED Prescriptions     Medication Sig Dispense Auth. Provider   cyclobenzaprine (FLEXERIL) 5 MG tablet Take 1 tablet (5 mg total) by mouth 3 (three) times daily as needed for muscle spasms. Do not drink alcohol or drive while taking this medication.  May cause drowsiness. 15 tablet Particia Nearing, [place]      PDMP not reviewed this encounter.   Belenda, [place], [place] 04/19/21 1101

## 2021-08-07 IMAGING — DX DG FOOT COMPLETE 3+V*R*
3 series · 3 of 3 positions shown · non-contrast
Comparison: None.

CLINICAL DATA: Right foot pain after fall yesterday.

EXAM:
RIGHT FOOT COMPLETE - 3+ VIEW

[foot ap]
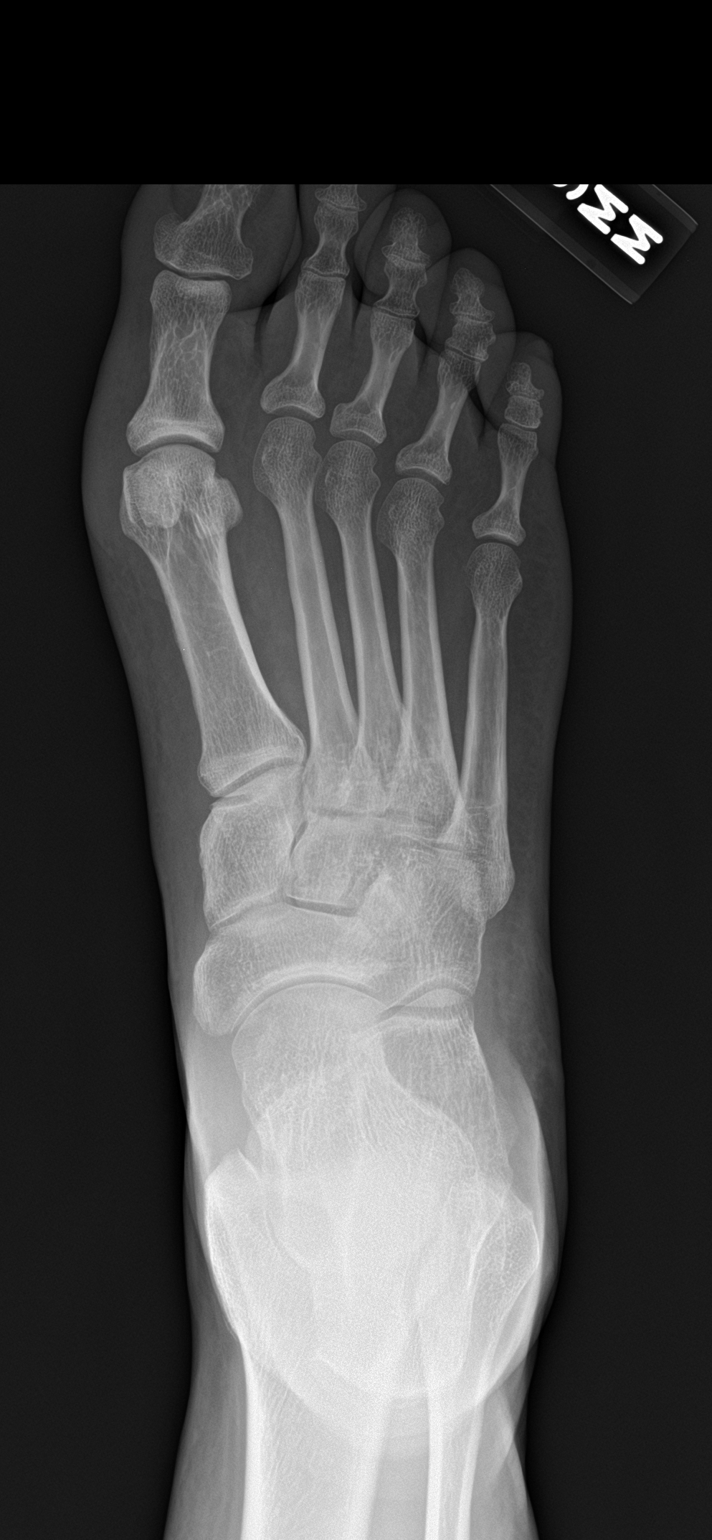

[foot obl]
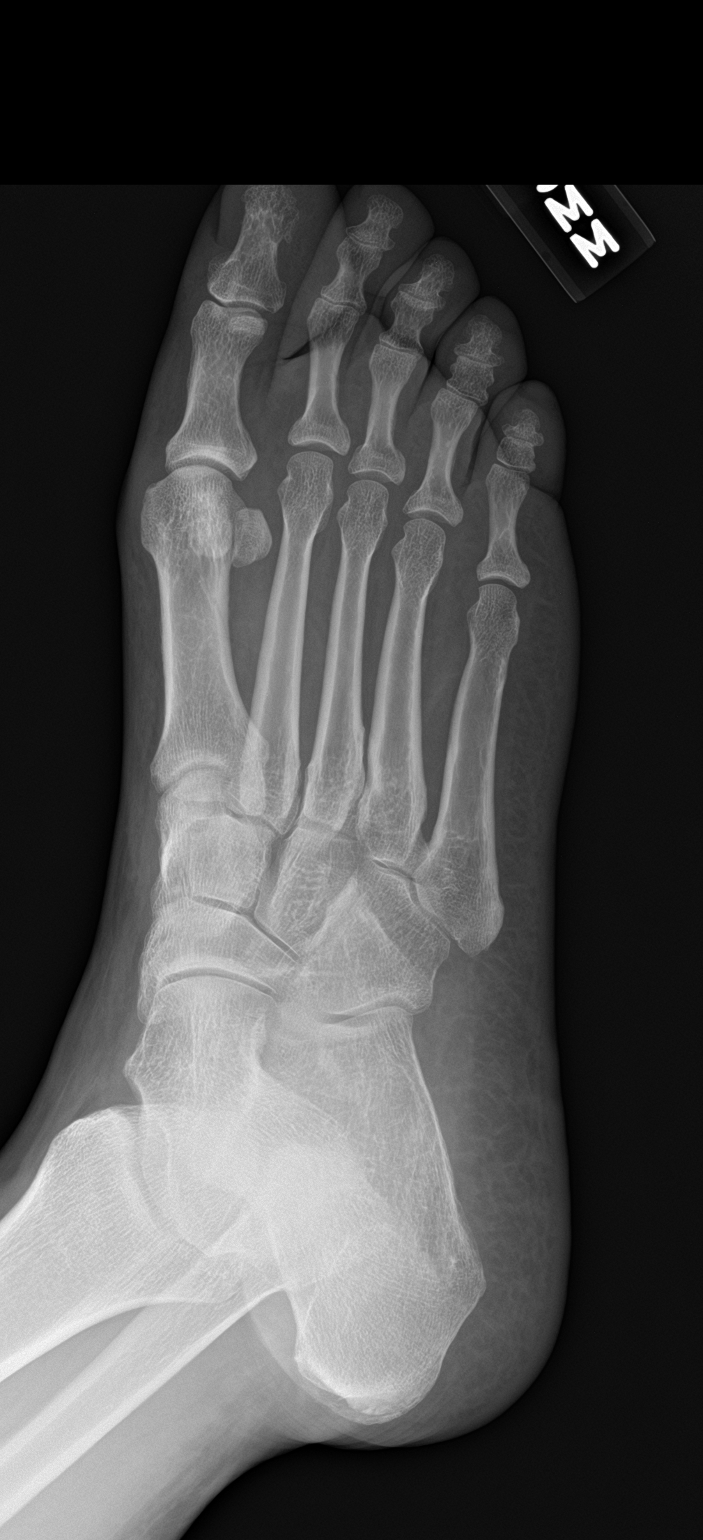

[foot lat]
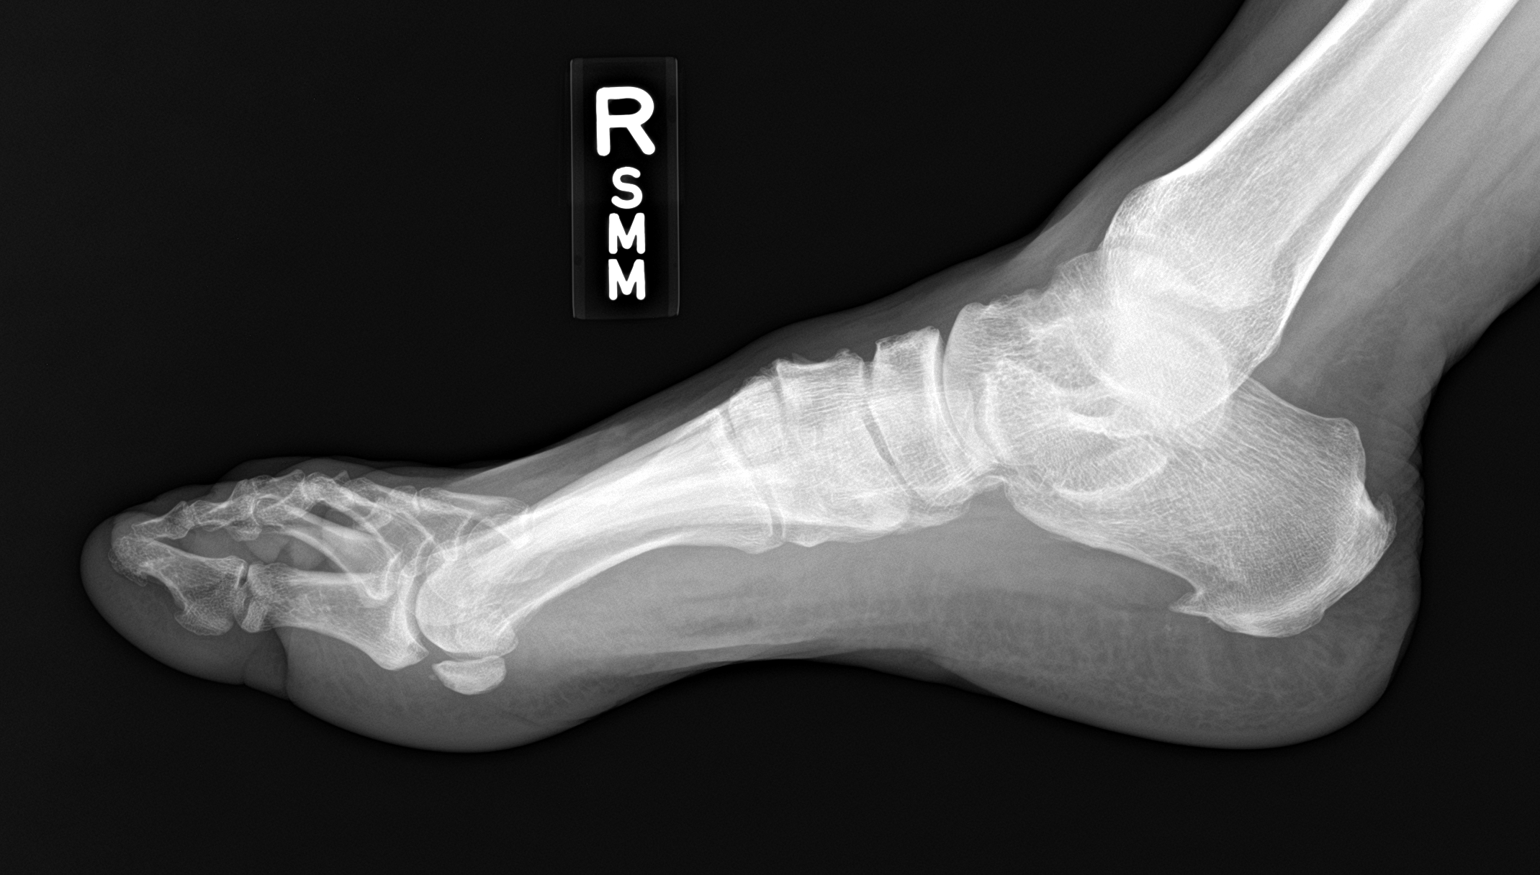

[3 of 3 positions shown; findings below may reference images not displayed]

FINDINGS: There is no evidence of fracture or dislocation. There is no
evidence of arthropathy or other focal bone abnormality. Soft
tissues are unremarkable.
IMPRESSION: Negative.

## 2022-02-10 ENCOUNTER — Emergency Department (HOSPITAL_COMMUNITY)
Admission: EM | Admit: 2022-02-10 | Discharge: 2022-02-10 | Disposition: A | Discharge status: Home / Self Care | Payer: BC Managed Care – PPO | Attending: Emergency Medicine | Admitting: Emergency Medicine

## 2022-02-10 ENCOUNTER — Encounter (HOSPITAL_COMMUNITY): Payer: Self-pay

## 2022-02-10 ENCOUNTER — Ambulatory Visit (HOSPITAL_COMMUNITY)
Admission: EM | Admit: 2022-02-10 | Discharge: 2022-02-10 | Disposition: A | Discharge status: Home / Self Care | Payer: BC Managed Care – PPO | Attending: Internal Medicine | Admitting: Internal Medicine

## 2022-02-10 ENCOUNTER — Encounter (HOSPITAL_COMMUNITY): Payer: Self-pay | Admitting: Emergency Medicine

## 2022-02-10 ENCOUNTER — Other Ambulatory Visit: Payer: Self-pay

## 2022-02-10 ENCOUNTER — Emergency Department (HOSPITAL_COMMUNITY): Payer: BC Managed Care – PPO

## 2022-02-10 DIAGNOSIS — M25511 Pain in right shoulder: Secondary | ICD-10-CM

## 2022-02-10 DIAGNOSIS — G8929 Other chronic pain: Secondary | ICD-10-CM | POA: Diagnosis not present

## 2022-02-10 DIAGNOSIS — R0789 Other chest pain: Secondary | ICD-10-CM | POA: Diagnosis present

## 2022-02-10 DIAGNOSIS — M25512 Pain in left shoulder: Secondary | ICD-10-CM | POA: Insufficient documentation

## 2022-02-10 DIAGNOSIS — R079 Chest pain, unspecified: Secondary | ICD-10-CM

## 2022-02-10 DIAGNOSIS — D649 Anemia, unspecified: Secondary | ICD-10-CM | POA: Diagnosis not present

## 2022-02-10 LAB — BASIC METABOLIC PANEL
Anion gap: 9 (ref 5–15)
BUN: 8 mg/dL (ref 6–20)
CO2: 20 mmol/L — ABNORMAL LOW (ref 22–32)
Calcium: 8.7 mg/dL — ABNORMAL LOW (ref 8.9–10.3)
Chloride: 109 mmol/L (ref 98–111)
Creatinine, Ser: 0.57 mg/dL (ref 0.44–1.00)
GFR, Estimated: >60 mL/min (ref >60)
Glucose, Bld: 81 mg/dL (ref 70–99)
Potassium: 3.9 mmol/L (ref 3.5–5.1)
Sodium: 138 mmol/L (ref 135–145)

## 2022-02-10 LAB — CBC
HCT: 24.7 % — ABNORMAL LOW (ref 36.0–46.0)
Hemoglobin: 7.1 g/dL — ABNORMAL LOW (ref 12.0–15.0)
MCH: 19.9 pg — ABNORMAL LOW (ref 26.0–34.0)
MCHC: 28.7 g/dL — ABNORMAL LOW (ref 30.0–36.0)
MCV: 69.4 fL — ABNORMAL LOW (ref 80.0–100.0)
Platelets: 213 10*3/uL (ref 150–400)
RBC: 3.56 MIL/uL — ABNORMAL LOW (ref 3.87–5.11)
RDW: 25.1 % — ABNORMAL HIGH (ref 11.5–15.5)
WBC: 6.3 10*3/uL (ref 4.0–10.5)
nRBC: 0 % (ref 0.0–0.2)

## 2022-02-10 LAB — I-STAT BETA HCG BLOOD, ED (MC, WL, AP ONLY): I-stat hCG, quantitative: <5 m[IU]/mL (ref <5)

## 2022-02-10 LAB — IRON AND TIBC
Iron: 44 ug/dL (ref 28–170)
Saturation Ratios: 7 % — ABNORMAL LOW (ref 10.4–31.8)
TIBC: 626 ug/dL — ABNORMAL HIGH (ref 250–450)
UIBC: 582 ug/dL

## 2022-02-10 LAB — FOLATE: Folate: 13.2 ng/mL (ref >5.9)

## 2022-02-10 LAB — VITAMIN B12: Vitamin B-12: 286 pg/mL (ref 180–914)

## 2022-02-10 LAB — TROPONIN I (HIGH SENSITIVITY)
Troponin I (High Sensitivity): 3 ng/L (ref <18)
Troponin I (High Sensitivity): 4 ng/L (ref <18)

## 2022-02-10 LAB — FERRITIN: Ferritin: 3 ng/mL — ABNORMAL LOW (ref 11–307)

## 2022-02-10 MED ORDER — KETOROLAC TROMETHAMINE 30 MG/ML IJ SOLN
30.0000 mg | Freq: Once | INTRAMUSCULAR | Status: AC
  Administered 2022-02-10: 30 mg via INTRAMUSCULAR
  Filled 2022-02-10: qty 1

## 2022-02-10 NOTE — ED Triage Notes (Signed)
Patient complains of recurrent bilateral shoulder pain for several months with any ROM. Patient also complains of cp around left chest/shoulder since monday

## 2022-02-12 ENCOUNTER — Inpatient Hospital Stay: Payer: BC Managed Care – PPO | Attending: Hematology and Oncology | Admitting: Hematology and Oncology

## 2022-02-12 ENCOUNTER — Telehealth: Payer: Self-pay | Admitting: Hematology and Oncology

## 2022-02-12 ENCOUNTER — Other Ambulatory Visit: Payer: Self-pay

## 2022-02-12 ENCOUNTER — Encounter: Payer: Self-pay | Admitting: Hematology and Oncology

## 2022-02-12 DIAGNOSIS — M791 Myalgia, unspecified site: Secondary | ICD-10-CM | POA: Diagnosis not present

## 2022-02-12 DIAGNOSIS — E559 Vitamin D deficiency, unspecified: Secondary | ICD-10-CM | POA: Diagnosis not present

## 2022-02-12 DIAGNOSIS — D500 Iron deficiency anemia secondary to blood loss (chronic): Secondary | ICD-10-CM | POA: Diagnosis present

## 2022-02-12 DIAGNOSIS — K909 Intestinal malabsorption, unspecified: Secondary | ICD-10-CM | POA: Diagnosis not present

## 2022-02-12 DIAGNOSIS — N920 Excessive and frequent menstruation with regular cycle: Secondary | ICD-10-CM | POA: Diagnosis present

## 2022-02-12 DIAGNOSIS — F17210 Nicotine dependence, cigarettes, uncomplicated: Secondary | ICD-10-CM | POA: Diagnosis not present

## 2022-02-17 ENCOUNTER — Inpatient Hospital Stay: Payer: BC Managed Care – PPO | Attending: Hematology and Oncology

## 2022-02-17 VITALS — BP 95/64 | HR 58 | Temp 98.8°F | Resp 16

## 2022-02-17 DIAGNOSIS — N920 Excessive and frequent menstruation with regular cycle: Secondary | ICD-10-CM | POA: Diagnosis present

## 2022-02-17 DIAGNOSIS — D500 Iron deficiency anemia secondary to blood loss (chronic): Secondary | ICD-10-CM | POA: Insufficient documentation

## 2022-02-17 MED ORDER — SODIUM CHLORIDE 0.9 % IV SOLN: Freq: Once | INTRAVENOUS | Status: AC

## 2022-02-17 MED ORDER — SODIUM CHLORIDE 0.9 % IV SOLN
400.0000 mg | Freq: Once | INTRAVENOUS | Status: AC
  Administered 2022-02-17: 400 mg via INTRAVENOUS
  Filled 2022-02-17: qty 20

## 2022-02-17 NOTE — Patient Instructions (Signed)

## 2022-02-19 ENCOUNTER — Telehealth: Payer: Self-pay

## 2022-02-19 NOTE — Telephone Encounter (Signed)
Called her regarding message received from infusion nurse. Told her she may return to work. Anemia not needing blood is not a reason for patients not to be able to work. She verbalized understanding.

## 2022-02-24 ENCOUNTER — Inpatient Hospital Stay: Payer: BC Managed Care – PPO

## 2022-02-24 VITALS — BP 103/47 | HR 55 | Temp 97.8°F | Resp 18 | Ht 70.0 in

## 2022-02-24 DIAGNOSIS — D500 Iron deficiency anemia secondary to blood loss (chronic): Secondary | ICD-10-CM

## 2022-02-24 MED ORDER — SODIUM CHLORIDE 0.9 % IV SOLN: Freq: Once | INTRAVENOUS | Status: AC

## 2022-02-24 MED ORDER — SODIUM CHLORIDE 0.9 % IV SOLN
400.0000 mg | Freq: Once | INTRAVENOUS | Status: AC
  Administered 2022-02-24: 400 mg via INTRAVENOUS
  Filled 2022-02-24: qty 20

## 2022-02-24 NOTE — Progress Notes (Signed)
Pt declined to stay 30 minute observation period. VSS, no distress noted at discharge.

## 2022-02-24 NOTE — Patient Instructions (Signed)

## 2022-03-02 ENCOUNTER — Inpatient Hospital Stay: Payer: BC Managed Care – PPO

## 2022-03-02 ENCOUNTER — Other Ambulatory Visit: Payer: Self-pay

## 2022-03-02 VITALS — BP 108/66 | HR 56 | Temp 98.9°F | Resp 16

## 2022-03-02 DIAGNOSIS — D500 Iron deficiency anemia secondary to blood loss (chronic): Secondary | ICD-10-CM | POA: Diagnosis not present

## 2022-03-02 MED ORDER — SODIUM CHLORIDE 0.9 % IV SOLN: Freq: Once | INTRAVENOUS | Status: AC

## 2022-03-02 MED ORDER — SODIUM CHLORIDE 0.9 % IV SOLN
400.0000 mg | Freq: Once | INTRAVENOUS | Status: AC
  Administered 2022-03-02: 400 mg via INTRAVENOUS
  Filled 2022-03-02: qty 20

## 2022-03-02 NOTE — Patient Instructions (Signed)

## 2022-03-07 ENCOUNTER — Encounter (HOSPITAL_COMMUNITY): Payer: Self-pay | Admitting: Emergency Medicine

## 2022-03-07 ENCOUNTER — Other Ambulatory Visit: Payer: Self-pay

## 2022-03-07 ENCOUNTER — Emergency Department (HOSPITAL_COMMUNITY): Payer: BC Managed Care – PPO

## 2022-03-07 ENCOUNTER — Emergency Department (HOSPITAL_COMMUNITY)
Admission: EM | Admit: 2022-03-07 | Discharge: 2022-03-08 | Disposition: A | Discharge status: Home / Self Care | Payer: BC Managed Care – PPO | Attending: Emergency Medicine | Admitting: Emergency Medicine

## 2022-03-07 DIAGNOSIS — Z79899 Other long term (current) drug therapy: Secondary | ICD-10-CM | POA: Insufficient documentation

## 2022-03-07 DIAGNOSIS — I639 Cerebral infarction, unspecified: Secondary | ICD-10-CM | POA: Diagnosis not present

## 2022-03-07 DIAGNOSIS — M7989 Other specified soft tissue disorders: Secondary | ICD-10-CM | POA: Insufficient documentation

## 2022-03-07 DIAGNOSIS — R202 Paresthesia of skin: Secondary | ICD-10-CM | POA: Diagnosis present

## 2022-03-07 DIAGNOSIS — Z20822 Contact with and (suspected) exposure to covid-19: Secondary | ICD-10-CM | POA: Diagnosis not present

## 2022-03-07 LAB — ETHANOL: Alcohol, Ethyl (B): <10 mg/dL (ref <10)

## 2022-03-07 LAB — URINALYSIS, ROUTINE W REFLEX MICROSCOPIC
Bilirubin Urine: NEGATIVE
Glucose, UA: NEGATIVE mg/dL
Hgb urine dipstick: NEGATIVE
Ketones, ur: NEGATIVE mg/dL
Leukocytes,Ua: NEGATIVE
Nitrite: NEGATIVE
Protein, ur: NEGATIVE mg/dL
Specific Gravity, Urine: 1.018 (ref 1.005–1.030)
pH: 5 (ref 5.0–8.0)

## 2022-03-07 LAB — RAPID URINE DRUG SCREEN, HOSP PERFORMED
Amphetamines: NOT DETECTED
Barbiturates: NOT DETECTED
Benzodiazepines: NOT DETECTED
Cocaine: NOT DETECTED
Opiates: NOT DETECTED
Tetrahydrocannabinol: POSITIVE — AB

## 2022-03-07 LAB — COMPREHENSIVE METABOLIC PANEL
ALT: 25 U/L (ref 0–44)
AST: 33 U/L (ref 15–41)
Albumin: 3.6 g/dL (ref 3.5–5.0)
Alkaline Phosphatase: 55 U/L (ref 38–126)
Anion gap: 6 (ref 5–15)
BUN: 6 mg/dL (ref 6–20)
CO2: 21 mmol/L — ABNORMAL LOW (ref 22–32)
Calcium: 8.7 mg/dL — ABNORMAL LOW (ref 8.9–10.3)
Chloride: 113 mmol/L — ABNORMAL HIGH (ref 98–111)
Creatinine, Ser: 0.69 mg/dL (ref 0.44–1.00)
GFR, Estimated: >60 mL/min (ref >60)
Glucose, Bld: 92 mg/dL (ref 70–99)
Potassium: 3.8 mmol/L (ref 3.5–5.1)
Sodium: 140 mmol/L (ref 135–145)
Total Bilirubin: 0.3 mg/dL (ref 0.3–1.2)
Total Protein: 6.4 g/dL — ABNORMAL LOW (ref 6.5–8.1)

## 2022-03-07 LAB — DIFFERENTIAL
Abs Immature Granulocytes: 0.01 10*3/uL (ref 0.00–0.07)
Basophils Absolute: 0 10*3/uL (ref 0.0–0.1)
Basophils Relative: 0 %
Eosinophils Absolute: 0.2 10*3/uL (ref 0.0–0.5)
Eosinophils Relative: 3 %
Immature Granulocytes: 0 %
Lymphocytes Relative: 39 %
Lymphs Abs: 2.1 10*3/uL (ref 0.7–4.0)
Monocytes Absolute: 0.5 10*3/uL (ref 0.1–1.0)
Monocytes Relative: 9 %
Neutro Abs: 2.7 10*3/uL (ref 1.7–7.7)
Neutrophils Relative %: 49 %

## 2022-03-07 LAB — RESP PANEL BY RT-PCR (FLU A&B, COVID) ARPGX2
Influenza A by PCR: NEGATIVE
Influenza B by PCR: NEGATIVE
SARS Coronavirus 2 by RT PCR: NEGATIVE

## 2022-03-07 LAB — I-STAT BETA HCG BLOOD, ED (MC, WL, AP ONLY): I-stat hCG, quantitative: <5 m[IU]/mL (ref <5)

## 2022-03-07 LAB — I-STAT CHEM 8, ED
BUN: 5 mg/dL — ABNORMAL LOW (ref 6–20)
Calcium, Ion: 1.06 mmol/L — ABNORMAL LOW (ref 1.15–1.40)
Chloride: 110 mmol/L (ref 98–111)
Creatinine, Ser: 0.6 mg/dL (ref 0.44–1.00)
Glucose, Bld: 92 mg/dL (ref 70–99)
HCT: 33 % — ABNORMAL LOW (ref 36.0–46.0)
Hemoglobin: 11.2 g/dL — ABNORMAL LOW (ref 12.0–15.0)
Potassium: 3.9 mmol/L (ref 3.5–5.1)
Sodium: 141 mmol/L (ref 135–145)
TCO2: 21 mmol/L — ABNORMAL LOW (ref 22–32)

## 2022-03-07 LAB — CBC
HCT: 33.6 % — ABNORMAL LOW (ref 36.0–46.0)
Hemoglobin: 9.9 g/dL — ABNORMAL LOW (ref 12.0–15.0)
MCH: 24.3 pg — ABNORMAL LOW (ref 26.0–34.0)
MCHC: 29.5 g/dL — ABNORMAL LOW (ref 30.0–36.0)
MCV: 82.4 fL (ref 80.0–100.0)
Platelets: 352 10*3/uL (ref 150–400)
RBC: 4.08 MIL/uL (ref 3.87–5.11)
RDW: 35.3 % — ABNORMAL HIGH (ref 11.5–15.5)
WBC: 5.4 10*3/uL (ref 4.0–10.5)
nRBC: 0 % (ref 0.0–0.2)

## 2022-03-07 LAB — APTT: aPTT: 31 seconds (ref 24–36)

## 2022-03-07 LAB — PROTIME-INR
INR: 1 (ref 0.8–1.2)
Prothrombin Time: 13.2 seconds (ref 11.4–15.2)

## 2022-03-07 NOTE — ED Notes (Signed)
Patient refused vitals.

## 2022-03-07 NOTE — ED Notes (Signed)
PT called X3 for triage no response. Will attempt again

## 2022-03-07 NOTE — ED Triage Notes (Signed)
Pt sent here by UC for LLE numbness. Pt reports she was laying on the couch yesterday and felt like her L leg from mid-calf down had fallen asleep. Pt reports numbness has continued, and has had cramping in calf w/ muscle spasms. Pt reports she has had some difficulty walking. Pt has no unilateral weakness, sensation intact in upper extremities. Sensation decreased on L lower leg

## 2022-03-07 NOTE — ED Provider Triage Note (Signed)
Emergency Medicine Provider Triage Evaluation Note  Ashley Ford , a 46 y.o. female  was evaluated in triage.  Pt complains of left lower extremity numbness.  Patient states that symptoms began insidiously around 4 to 5 PM last night.  She resisted coming to the emergency department hoping the symptoms would abate overnight.  They persisted into the morning.  She denies overt weakness but just persistent numbness from her knee down.  She states that it feels like her leg is asleep but will not wake up.  She denies history of hypertension, diabetes, prior history of CVA or MI.  She does smoke cigarettes daily.  Denies fever, chills, night sweats, chest pain, shortness of breath, abdominal pain, nausea/vomiting/diarrhea, urinary symptoms, change in bowel habits  Review of Systems  Positive: See above Negative:   Physical Exam  BP 134/68 (BP Location: Right Arm)   Pulse 75   Temp 98.3 F (36.8 C) (Oral)   Resp 15   LMP 03/01/2022 Comment: Just finished her period  SpO2 100%  Gen:   Awake, no distress   Resp:  Normal effort  MSK:   Moves extremities without difficulty  Other:  PERRLA bilaterally.  EOMs full and intact bilaterally.  Cranial nerves III through XII grossly intact.  Medical Decision Making  Medically screening exam initiated at 9:08 PM.  Appropriate orders placed.  LASHONA SCHAAF was informed that the remainder of the evaluation will be completed by another provider, this initial triage assessment does not replace that evaluation, and the importance of remaining in the ED until their evaluation is complete.     Peter Garter, [place] 03/07/22 2111

## 2022-03-08 ENCOUNTER — Emergency Department (HOSPITAL_BASED_OUTPATIENT_CLINIC_OR_DEPARTMENT_OTHER): Payer: BC Managed Care – PPO

## 2022-03-08 DIAGNOSIS — R202 Paresthesia of skin: Secondary | ICD-10-CM | POA: Diagnosis not present

## 2022-03-08 MED ORDER — PREDNISONE 10 MG (21) PO TBPK: ORAL_TABLET | Freq: Every day | ORAL | 0 refills | Status: DC

## 2022-03-08 MED ORDER — ACETAMINOPHEN 500 MG PO TABS: 1000.0000 mg | ORAL_TABLET | Freq: Four times a day (QID) | ORAL | 0 refills | Status: DC | PRN

## 2022-03-08 NOTE — ED Notes (Signed)
Transported to vascular lab for u/s

## 2022-03-08 NOTE — Progress Notes (Signed)
LLE venous duplex has been completed.  Preliminary results given to [facility], PA-C.   Results can be found under chart review under CV PROC. 03/08/2022 11:09 AM Jilliane Kazanjian RVT, RDMS

## 2022-03-08 NOTE — ED Provider Notes (Signed)
[facility] EMERGENCY DEPARTMENT Provider Note   CSN: [id number] Arrival date & time: 03/07/22  2023     History  Chief Complaint  Patient presents with   Numbness    Ashley Ford is a 45 y.o. female.  HPI  Patient is a 46 year old female with past medical history significant for vitamin D deficiency, anemia, reflux, depression  Patient is present emergency room today after being referred to the ER by urgent care for left leg numbness.  She states that it starts in her mid shin and seems to extend down to her foot.  She denies any weakness she denies any slurred speech confusion or any upper or lower extremity weakness.  She denies any numbness elsewhere besides her left lower lateral leg.  She states she wears steel toed boots at work and was curious if some of her symptoms might be related to this.  No other symptoms.  She denies any pain.  She states that the numbness is more of a "feels like when my leg fell asleep "sensation.  She states that she has some tingling sensations.  She states that she still can feel when her leg is being touched.     Home Medications Prior to Admission medications   Medication Sig Start Date End Date Taking? Authorizing Provider  b complex vitamins capsule Take 1 capsule by mouth daily.    [provider]  calcium carbonate (OS-CAL) 1250 (500 Ca) MG chewable tablet Chew 1 tablet by mouth daily.    [provider]  cyclobenzaprine (FLEXERIL) 5 MG tablet Take 1 tablet (5 mg total) by mouth 3 (three) times daily as needed for muscle spasms. Do not drink alcohol or drive while taking this medication.  May cause drowsiness. 04/19/21   Volney American, PA-C  meloxicam (MOBIC) 7.5 MG tablet Take 1 tablet (7.5 mg total) by mouth daily. 09/01/19   Jaynee Eagles, PA-C      Allergies    Penicillins    Review of Systems   Review of Systems  Physical Exam Updated Vital Signs BP 108/68 (BP Location: Left Arm)    Pulse 65   Temp 98.1 F (36.7 C) (Oral)   Resp 15   LMP 03/01/2022 Comment: Just finished her period  SpO2 100%  Physical Exam Vitals and nursing note reviewed.  Constitutional:      General: She is not in acute distress. HENT:     Head: Normocephalic and atraumatic.     Nose: Nose normal.  Eyes:     General: No scleral icterus. Cardiovascular:     Rate and Rhythm: Normal rate and regular rhythm.     Pulses: Normal pulses.     Heart sounds: Normal heart sounds.     Comments: DP PT pulses 3+ and symmetric cap refill less than 3 seconds in all toes Pulmonary:     Effort: Pulmonary effort is normal. No respiratory distress.     Breath sounds: No wheezing.  Abdominal:     Palpations: Abdomen is soft.     Tenderness: There is no abdominal tenderness.  Musculoskeletal:     Cervical back: Normal range of motion.     Right lower leg: No edema.     Left lower leg: No edema.  Skin:    General: Skin is warm and dry.     Capillary Refill: Capillary refill takes less than 2 seconds.  Neurological:     Mental Status: She is alert. Mental status is at  baseline.     Comments: Alert and oriented to self, place, time and event.   Speech is fluent, clear without dysarthria or dysphasia.   Strength 5/5 in upper/lower extremities   Sensation intact in upper/lower extremities but decreased sharp/dull discrimination in LLE   Normal gait.  CN I not tested  CN II grossly intact visual fields bilaterally. Did not visualize posterior eye.  CN III, IV, VI PERRLA and EOMs intact bilaterally  CN V Intact sensation to sharp and light touch to the face  CN VII facial movements symmetric  CN VIII not tested  CN IX, X no uvula deviation, symmetric rise of soft palate  CN XI 5/5 SCM and trapezius strength bilaterally  CN XII Midline tongue protrusion, symmetric L/R movements   Psychiatric:        Mood and Affect: Mood normal.        Behavior: Behavior normal.     ED Results / Procedures /  Treatments   Labs (all labs ordered are listed, but only abnormal results are displayed) Labs Reviewed  CBC - Abnormal; Notable for the following components:      Result Value   Hemoglobin 9.9 (*)    HCT 33.6 (*)    MCH 24.3 (*)    MCHC 29.5 (*)    RDW 35.3 (*)    All other components within normal limits  COMPREHENSIVE METABOLIC PANEL - Abnormal; Notable for the following components:   Chloride 113 (*)    CO2 21 (*)    Calcium 8.7 (*)    Total Protein 6.4 (*)    All other components within normal limits  RAPID URINE DRUG SCREEN, HOSP PERFORMED - Abnormal; Notable for the following components:   Tetrahydrocannabinol POSITIVE (*)    All other components within normal limits  URINALYSIS, ROUTINE W REFLEX MICROSCOPIC - Abnormal; Notable for the following components:   APPearance HAZY (*)    All other components within normal limits  I-STAT CHEM 8, ED - Abnormal; Notable for the following components:   BUN 5 (*)    Calcium, Ion 1.06 (*)    TCO2 21 (*)    Hemoglobin 11.2 (*)    HCT 33.0 (*)    All other components within normal limits  RESP PANEL BY RT-PCR (FLU A&B, COVID) ARPGX2  ETHANOL  PROTIME-INR  APTT  DIFFERENTIAL  I-STAT BETA HCG BLOOD, ED ([facility], [facility], [facility] ONLY)    EKG EKG Interpretation  Date/Time:  Wednesday March 07 2022 21:29:13 EDT Ventricular Rate:  75 PR Interval:  144 QRS Duration: 84 QT Interval:  388 QTC Calculation: 433 R Axis:   79 Text Interpretation: Normal sinus rhythm When compared with ECG of 10-Feb-2022 12:49, PREVIOUS ECG IS PRESENT NO SIGNIFICANT CHANGE SINCE LAST TRACING YESTERDAY Confirmed by Octaviano Glow [phone]) on 03/08/2022 10:00:06 AM  Radiology VAS [place] LOWER EXTREMITY VENOUS (DVT) (7a-7p)  Result Date: 03/08/2022  Lower Venous DVT Study Patient Name:  Ashley Ford  Date of Exam:   03/08/2022 Medical Rec #: [id number]     Accession #:    [id number] Date of Birth: 05-08-1976     Patient Gender: F Patient Age:   41 years Exam Location:  [facility] Procedure:      VAS [place] LOWER EXTREMITY VENOUS (DVT) Referring Phys: Ova Freshwater Ifeoluwa Bartz --------------------------------------------------------------------------------  Indications: Numbness to left calf/ankle.  Comparison Study: No previous exams Performing Technologist: Jody Hill RVT, RDMS  Examination Guidelines: A complete evaluation includes B-mode imaging, spectral Doppler, color  Doppler, and power Doppler as needed of all accessible portions of each vessel. Bilateral testing is considered an integral part of a complete examination. Limited examinations for reoccurring indications may be performed as noted. The reflux portion of the exam is performed with the patient in reverse Trendelenburg.  +-----+---------------+---------+-----------+----------+--------------+ RIGHTCompressibilityPhasicitySpontaneityPropertiesThrombus Aging +-----+---------------+---------+-----------+----------+--------------+ CFV  Full           Yes      Yes                                 +-----+---------------+---------+-----------+----------+--------------+   +---------+---------------+---------+-----------+----------+--------------+ LEFT     CompressibilityPhasicitySpontaneityPropertiesThrombus Aging +---------+---------------+---------+-----------+----------+--------------+ CFV      Full           Yes      Yes                                 +---------+---------------+---------+-----------+----------+--------------+ SFJ      Full                                                        +---------+---------------+---------+-----------+----------+--------------+ FV Prox  Full           Yes      Yes                                 +---------+---------------+---------+-----------+----------+--------------+ FV Mid   Full           Yes      Yes                                 +---------+---------------+---------+-----------+----------+--------------+ FV DistalFull           Yes       Yes                                 +---------+---------------+---------+-----------+----------+--------------+ PFV      Full                                                        +---------+---------------+---------+-----------+----------+--------------+ POP      Full           Yes      Yes                                 +---------+---------------+---------+-----------+----------+--------------+ PTV      Full                                                        +---------+---------------+---------+-----------+----------+--------------+ PERO     Full                                                        +---------+---------------+---------+-----------+----------+--------------+  Summary: RIGHT: - No evidence of common femoral vein obstruction.  LEFT: - There is no evidence of deep vein thrombosis in the lower extremity.  - No cystic structure found in the popliteal fossa.  *See table(s) above for measurements and observations.    Preliminary    CT Head Wo Contrast  Result Date: 03/07/2022 CLINICAL DATA:  Neurologic deficit. EXAM: CT HEAD WITHOUT CONTRAST TECHNIQUE: Contiguous axial images were obtained from the base of the skull through the vertex without intravenous contrast. RADIATION DOSE REDUCTION: This exam was performed according to the departmental dose-optimization program which includes automated exposure control, adjustment of the mA and/or kV according to patient size and/or use of iterative reconstruction technique. COMPARISON:  None Available. FINDINGS: Brain: The ventricles and sulci are appropriate size for the patient's age. The gray-white matter discrimination is preserved. There is no acute intracranial hemorrhage. No mass effect or midline shift. No extra-axial fluid collection. Vascular: No hyperdense vessel or unexpected calcification. Skull: Normal. Negative for fracture or focal lesion. Sinuses/Orbits: Mild mucoperiosteal thickening of paranasal  sinuses. No air-fluid level. The mastoid air cells are clear. Other: None IMPRESSION: No acute intracranial pathology. Electronically Signed   By: Elgie Collard M.D.   On: 03/07/2022 23:35    Procedures Procedures    Medications Ordered in ED Medications - No data to display  ED Course/ Medical Decision Making/ A&P                           Medical Decision Making  This patient presents to the ED for concern of paresthesias, this involves a number of treatment options, and is a complaint that carries with it a moderate risk of complications and morbidity.  The differential diagnosis includes peripheral neuropathy, pinched nerve, trauma, no back pain making central canal stenosis/cauda equina less likely.   Co morbidities: Discussed in HPI   Brief History:  Patient is a 46 year old female with past medical history significant for vitamin D deficiency, anemia, reflux, depression  Patient is present emergency room today after being referred to the ER by urgent care for left leg numbness.  She states that it starts in her mid shin and seems to extend down to her foot.  She denies any weakness she denies any slurred speech confusion or any upper or lower extremity weakness.  She denies any numbness elsewhere besides her left lower lateral leg.  She states she wears steel toed boots at work and was curious if some of her symptoms might be related to this.  No other symptoms.  She denies any pain.  She states that the numbness is more of a "feels like when my leg fell asleep "sensation.  She states that she has some tingling sensations.  She states that she still can feel when her leg is being touched.   Physical exam relatively unremarkable.  Patient does seem to have some decreased sensation in her left lower lateral leg.  Otherwise her neurologic exam is normal and she does have sensation in all areas although somewhat altered to the above described area.    EMR reviewed including  pt PMHx, past surgical history and past visits to ER.   See HPI for more details   Lab Tests:   I ordered and independently interpreted labs. Labs notable for CMP without significant abnormalities.  CBC with anemia but actually improved from prior likely secondary to her iron transfusion.  COVID influenza negative.  UDS positive for THC  otherwise unremarkable.  Urinalysis unremarkable.  hCG negative for pregnancy.  I reviewed all of these labs which were ordered in triage   Imaging Studies:  NAD. I personally reviewed all imaging studies and no acute abnormality found. I agree with radiology interpretation. CT head unremarkable  IMPRESSION:  No acute intracranial pathology.      Electronically Signed   LLE [place] negative for DVT.   Cardiac Monitoring:  The patient was maintained on a cardiac monitor.  I personally viewed and interpreted the cardiac monitored which showed an underlying rhythm of: EKG non-ischemic   Medicines ordered:  No medications given.    Critical Interventions:     Consults/Attending Physician   I discussed this case with my attending physician who cosigned this note including patient's presenting symptoms, physical exam, and planned diagnostics and interventions. Attending physician stated agreement with plan or made changes to plan which were implemented.   Reevaluation:  After the interventions noted above I re-evaluated patient and found that they have :stayed the same   Social Determinants of Health:      Problem List / ED Course:  Patient has symptoms of left lower extremity numbness on further questioning it seems to be more of a paresthesia.  Reassuring labs here in the ER and CT head were obtained which are all without abnormal findings.  I did obtain a lower extremity ultrasound to confirm no DVT.  I recommend that she follow-up with neurology given her abnormal symptoms.   Dispostion:  After consideration of the diagnostic  results and the patients response to treatment, I feel that the patent would benefit from outpatient follow-up with neurology, strict return precautions given for any weakness slurred speech confusion or or back pain or any other new or concerning symptoms     Final Clinical Impression(s) / ED Diagnoses Final diagnoses:  Left leg paresthesias    Rx / DC Orders ED Discharge Orders     None         Tedd Sias, [place] 03/08/22 1523    Wyvonnia Dusky, MD 03/08/22 1650

## 2022-03-08 NOTE — ED Notes (Signed)
Pt provided discharge instructions and prescription information. Pt was given the opportunity to ask questions and questions were answered.   

## 2022-03-08 NOTE — Discharge Instructions (Addendum)
Please take prednisone as prescribed Please follow-up with neurology I given you the office information for 2 different offices.  Please return immediately to the emergency room should experience any weakness, slurring of your speech, confusion or any other new or concerning symptoms.  Your ultrasound did not show any blood clots in your legs and your CT scan of your head was within normal limits.  You do appear to be somewhat anemic on your blood counts however improved from how you have been in the past.

## 2022-03-12 ENCOUNTER — Encounter (INDEPENDENT_AMBULATORY_CARE_PROVIDER_SITE_OTHER): Payer: Self-pay

## 2022-03-12 ENCOUNTER — Encounter: Payer: Self-pay | Admitting: Hematology and Oncology

## 2022-03-12 ENCOUNTER — Other Ambulatory Visit: Payer: Self-pay | Admitting: Internal Medicine

## 2022-03-12 DIAGNOSIS — Z1231 Encounter for screening mammogram for malignant neoplasm of breast: Secondary | ICD-10-CM

## 2022-03-14 ENCOUNTER — Ambulatory Visit
Admission: RE | Admit: 2022-03-14 | Discharge: 2022-03-14 | Disposition: A | Discharge status: Home / Self Care | Payer: BC Managed Care – PPO | Source: Ambulatory Visit

## 2022-03-14 DIAGNOSIS — Z1231 Encounter for screening mammogram for malignant neoplasm of breast: Secondary | ICD-10-CM

## 2022-04-13 ENCOUNTER — Encounter: Payer: Self-pay | Admitting: Hematology and Oncology

## 2022-04-13 ENCOUNTER — Inpatient Hospital Stay: Payer: BC Managed Care – PPO

## 2022-04-13 ENCOUNTER — Other Ambulatory Visit: Payer: Self-pay

## 2022-04-13 ENCOUNTER — Inpatient Hospital Stay: Payer: BC Managed Care – PPO | Attending: Hematology and Oncology

## 2022-04-13 ENCOUNTER — Inpatient Hospital Stay (HOSPITAL_BASED_OUTPATIENT_CLINIC_OR_DEPARTMENT_OTHER): Payer: BC Managed Care – PPO | Admitting: Hematology and Oncology

## 2022-04-13 DIAGNOSIS — E559 Vitamin D deficiency, unspecified: Secondary | ICD-10-CM | POA: Diagnosis not present

## 2022-04-13 DIAGNOSIS — D500 Iron deficiency anemia secondary to blood loss (chronic): Secondary | ICD-10-CM | POA: Insufficient documentation

## 2022-04-13 DIAGNOSIS — N920 Excessive and frequent menstruation with regular cycle: Secondary | ICD-10-CM

## 2022-04-13 LAB — CBC WITH DIFFERENTIAL (CANCER CENTER ONLY)
Abs Immature Granulocytes: 0 10*3/uL (ref 0.00–0.07)
Basophils Absolute: 0 10*3/uL (ref 0.0–0.1)
Basophils Relative: 0 %
Eosinophils Absolute: 0.2 10*3/uL (ref 0.0–0.5)
Eosinophils Relative: 4 %
HCT: 35.1 % — ABNORMAL LOW (ref 36.0–46.0)
Hemoglobin: 11.2 g/dL — ABNORMAL LOW (ref 12.0–15.0)
Immature Granulocytes: 0 %
Lymphocytes Relative: 37 %
Lymphs Abs: 1.5 10*3/uL (ref 0.7–4.0)
MCH: 27.7 pg (ref 26.0–34.0)
MCHC: 31.9 g/dL (ref 30.0–36.0)
MCV: 86.7 fL (ref 80.0–100.0)
Monocytes Absolute: 0.4 10*3/uL (ref 0.1–1.0)
Monocytes Relative: 9 %
Neutro Abs: 2 10*3/uL (ref 1.7–7.7)
Neutrophils Relative %: 50 %
Platelet Count: 250 10*3/uL (ref 150–400)
RBC: 4.05 MIL/uL (ref 3.87–5.11)
RDW: 28 % — ABNORMAL HIGH (ref 11.5–15.5)
WBC Count: 4 10*3/uL (ref 4.0–10.5)
nRBC: 0 % (ref 0.0–0.2)

## 2022-04-13 LAB — TSH: TSH: 1.366 u[IU]/mL (ref 0.350–4.500)

## 2022-04-13 LAB — IRON AND IRON BINDING CAPACITY (CC-WL,HP ONLY)
Iron: 40 ug/dL (ref 28–170)
Saturation Ratios: 9 % — ABNORMAL LOW (ref 10.4–31.8)
TIBC: 448 ug/dL (ref 250–450)
UIBC: 408 ug/dL (ref 148–442)

## 2022-04-13 LAB — FERRITIN: Ferritin: 8 ng/mL — ABNORMAL LOW (ref 11–307)

## 2022-04-13 LAB — ABO/RH: ABO/RH(D): AB POS

## 2022-04-13 LAB — VITAMIN D 25 HYDROXY (VIT D DEFICIENCY, FRACTURES): Vit D, 25-Hydroxy: 17.59 ng/mL — ABNORMAL LOW (ref 30–100)

## 2022-04-13 NOTE — Assessment & Plan Note (Signed)
Her anemia has improved but not all the way to normal Iron studies are pending She had no infusion reactions and the iron replacement therapy has helped her We will proceed with 1 more dose of treatment today I recommend repeat CBC and iron studies in 3 months or so The patient has a lot of appointments to see multiple doctors; she will try to get her CBC checked with 1 of those physicians and call me with results

## 2022-04-13 NOTE — Assessment & Plan Note (Signed)
She has appointment pending to see a gynecologist for management of menorrhagia

## 2022-04-13 NOTE — Assessment & Plan Note (Signed)
Vitamin D level is pending We will call the patient with test results

## 2022-04-13 NOTE — Progress Notes (Signed)
[facility] OFFICE PROGRESS NOTE  Pcp, No  ASSESSMENT & PLAN:  Iron deficiency anemia due to chronic blood loss Her anemia has improved but not all the way to normal Iron studies are pending She had no infusion reactions and the iron replacement therapy has helped her We will proceed with 1 more dose of treatment today I recommend repeat CBC and iron studies in 3 months or so The patient has a lot of appointments to see multiple doctors; she will try to get her CBC checked with 1 of those physicians and call me with results  Menorrhagia She has appointment pending to see a gynecologist for management of menorrhagia  Vitamin D deficiency Vitamin D level is pending We will call the patient with test results  No orders of the defined types were placed in this encounter.   The total time spent in the appointment was 20 minutes encounter with patients including review of chart and various tests results, discussions about plan of care and coordination of care plan   All questions were answered. The patient knows to call the clinic with any problems, questions or concerns. No barriers to learning was detected.    Artis Delay, MD 8/25/20239:39 AM  INTERVAL HISTORY: Ashley Ford 47 y.o. female returns for further follow-up regarding iron deficiency anemia secondary to menorrhagia She felt great improvement of energy level after receiving 3 doses of intravenous iron sucrose No infusion reactions She continues to have intermittent menorrhagia  SUMMARY OF HEMATOLOGIC HISTORY:  She was found to have abnormal CBC from recent blood work On 02/10/2022, she was seen in the emergency department and noted to hemoglobin 7.1, MCV of 69 with confirm iron deficiency with iron studies.  Historically, CBC from 2008/02/21 showed hemoglobin of 11.1 with normal MCV According to the patient, she has been anemic all her life.  At 1 point, she stated her hemoglobin was less than 2 She  denies recent chest pain on exertion, shortness of breath on minimal exertion, pre-syncopal episodes, or palpitations. She complains of fatigue and diffuse myalgias  She had not noticed any recent bleeding such as epistaxis, hematuria.  She has intermittent rectal bleeding due to constipation. She complained of menorrhagia; her menstrual cycle is variable, lasting up to 7 days and occasionally associated with passage of large amount of clots The patient takes over the counter NSAID. She is not on antiplatelets agents. She has never had colonoscopy.  She denies history of abnormal Pap smear She had no prior history or diagnosis of cancer. Her age appropriate screening programs are up-to-date. She denies any pica and eats a variety of diet. She never donated blood or received blood transfusion The patient was prescribed oral iron supplements and she takes sporadically because it causes upset stomach. In July 2023, she received 3 doses of intravenous iron sucrose 400 mg  I have reviewed the past medical history, past surgical history, social history and family history with the patient and they are unchanged from previous note.  ALLERGIES:  is allergic to penicillins.  MEDICATIONS:  Current Outpatient Medications  Medication Sig Dispense Refill   acetaminophen (TYLENOL) 500 MG tablet Take 2 tablets (1,000 mg total) by mouth every 6 (six) hours as needed. 30 tablet 0   b complex vitamins capsule Take 1 capsule by mouth daily.     calcium carbonate (OS-CAL) 1250 (500 Ca) MG chewable tablet Chew 1 tablet by mouth daily.     cyclobenzaprine (FLEXERIL) 5 MG tablet Take  1 tablet (5 mg total) by mouth 3 (three) times daily as needed for muscle spasms. Do not drink alcohol or drive while taking this medication.  May cause drowsiness. 15 tablet 0   meloxicam (MOBIC) 7.5 MG tablet Take 1 tablet (7.5 mg total) by mouth daily. 30 tablet 0   predniSONE (STERAPRED UNI-PAK 21 TAB) 10 MG (21) TBPK tablet Take by  mouth daily. Take 6 tabs by mouth daily  for 2 days, then 5 tabs for 2 days, then 4 tabs for 2 days, then 3 tabs for 2 days, 2 tabs for 2 days, then 1 tab by mouth daily for 2 days 42 tablet 0   No current facility-administered medications for this visit.     REVIEW OF SYSTEMS:   Constitutional: Denies fevers, chills or night sweats Eyes: Denies blurriness of vision Ears, nose, mouth, throat, and face: Denies mucositis or sore throat Respiratory: Denies cough, dyspnea or wheezes Cardiovascular: Denies palpitation, chest discomfort or lower extremity swelling Gastrointestinal:  Denies nausea, heartburn or change in bowel habits Skin: Denies abnormal skin rashes Lymphatics: Denies new lymphadenopathy or easy bruising Neurological:Denies numbness, tingling or new weaknesses Behavioral/Psych: Mood is stable, no new changes  All other systems were reviewed with the patient and are negative.  PHYSICAL EXAMINATION: ECOG PERFORMANCE STATUS: 0 - Asymptomatic  Vitals:   04/13/22 0914  BP: 103/75  Pulse: (!) 124  Resp: 18  Temp: 97.8 F (36.6 C)  SpO2: 99%   Filed Weights   04/13/22 0914  Weight: 168 lb (76.2 kg)    GENERAL:alert, no distress and comfortable NEURO: alert & oriented x 3 with fluent speech, no focal motor/sensory deficits  LABORATORY DATA:  I have reviewed the data as listed     Component Value Date/Time   NA 141 03/07/2022 2201   NA 141 02/15/2017 0953   K 3.9 03/07/2022 2201   CL 110 03/07/2022 2201   CO2 21 (L) 03/07/2022 2114   GLUCOSE 92 03/07/2022 2201   BUN 5 (L) 03/07/2022 2201   BUN 6 02/15/2017 0953   CREATININE 0.60 03/07/2022 2201   CALCIUM 8.7 (L) 03/07/2022 2114   PROT 6.4 (L) 03/07/2022 2114   PROT 6.8 02/15/2017 0953   ALBUMIN 3.6 03/07/2022 2114   ALBUMIN 3.9 02/15/2017 0953   AST 33 03/07/2022 2114   ALT 25 03/07/2022 2114   ALKPHOS 55 03/07/2022 2114   BILITOT 0.3 03/07/2022 2114   BILITOT <0.2 02/15/2017 0953   GFRNONAA >60  03/07/2022 2114   GFRAA 128 02/15/2017 0953    No results found for: "SPEP", "UPEP"  Lab Results  Component Value Date   WBC 4.0 04/13/2022   NEUTROABS 2.0 04/13/2022   HGB 11.2 (L) 04/13/2022   HCT 35.1 (L) 04/13/2022   MCV 86.7 04/13/2022   PLT 250 04/13/2022      Chemistry      Component Value Date/Time   NA 141 03/07/2022 2201   NA 141 02/15/2017 0953   K 3.9 03/07/2022 2201   CL 110 03/07/2022 2201   CO2 21 (L) 03/07/2022 2114   BUN 5 (L) 03/07/2022 2201   BUN 6 02/15/2017 0953   CREATININE 0.60 03/07/2022 2201      Component Value Date/Time   CALCIUM 8.7 (L) 03/07/2022 2114   ALKPHOS 55 03/07/2022 2114   AST 33 03/07/2022 2114   ALT 25 03/07/2022 2114   BILITOT 0.3 03/07/2022 2114   BILITOT <0.2 02/15/2017 7510

## 2022-04-14 ENCOUNTER — Inpatient Hospital Stay: Payer: BC Managed Care – PPO

## 2022-04-14 VITALS — BP 134/97 | HR 78 | Temp 97.9°F | Resp 17

## 2022-04-14 DIAGNOSIS — D500 Iron deficiency anemia secondary to blood loss (chronic): Secondary | ICD-10-CM | POA: Diagnosis not present

## 2022-04-14 MED ORDER — SODIUM CHLORIDE 0.9 % IV SOLN: Freq: Once | INTRAVENOUS | Status: AC

## 2022-04-14 MED ORDER — SODIUM CHLORIDE 0.9 % IV SOLN
400.0000 mg | Freq: Once | INTRAVENOUS | Status: AC
  Administered 2022-04-14: 400 mg via INTRAVENOUS
  Filled 2022-04-14: qty 20

## 2022-04-14 NOTE — Patient Instructions (Signed)

## 2022-04-14 NOTE — Progress Notes (Signed)
Patient c/o painful IV near completion of infusion.  RN assessed IV site.  No redness or infiltration noted.  Positive for blood return when attempted by RN.  Ice pack applied to affected area.  Patient requested to leave.  RN recommended for patient to notify cancer center if pain does not improve.  Patient expresses understanding.  Patient ambulatory at discharge.  No s/s or c/o of distress.

## 2022-04-17 ENCOUNTER — Telehealth: Payer: Self-pay | Admitting: *Deleted

## 2022-04-17 NOTE — Telephone Encounter (Signed)
Hi Steward Drone,  If you try again and cannot reach her, please send her a letter to call us back Thanks

## 2022-04-17 NOTE — Telephone Encounter (Addendum)
-----   Message from Artis Delay, MD sent at 04/13/2022  3:12 PM EDT ----- Pls call her  Labs confirmed vitamin D def I suggest OTC vitamin D 5000 units daily for 3 months  Her iron studies confirmed persistent iron def, recommend she keeps her iv iron appt tomorrow  Attempted to contact pt- per mobile number received message " mailbox has not been set up"- per home number-message received stating " call cannot be completed as dialed".  This message will be forwarded to MD and covering nurse for further follow up.

## 2022-04-18 ENCOUNTER — Encounter: Payer: Self-pay | Admitting: Diagnostic Neuroimaging

## 2022-04-18 ENCOUNTER — Ambulatory Visit: Payer: BC Managed Care – PPO | Admitting: Diagnostic Neuroimaging

## 2022-04-18 VITALS — BP 101/67 | HR 65 | Ht 70.0 in | Wt 167.5 lb

## 2022-04-18 DIAGNOSIS — R200 Anesthesia of skin: Secondary | ICD-10-CM | POA: Diagnosis not present

## 2022-04-18 NOTE — Patient Instructions (Signed)
LEFT LEG NUMBNESS (intermittent since ~Jan 2023; now consistent since July 2023) - check MRI lumbar spine (tried stretching, exercises, tumeric, naprosyn, meloxicam, flexeril)

## 2022-04-18 NOTE — Progress Notes (Signed)
[facility]  PATIENT: Ashley Ford DOB: July 21, 1976  REFERRING CLINICIAN: Gailen Shelter, PA HISTORY FROM: PATIENT REASON FOR VISIT: NEW CONSULT   HISTORICAL  CHIEF COMPLAINT:  Chief Complaint  Patient presents with   New Patient (Initial Visit)    Pt feels like her whole body is in repair mode. She is asking about a scan to see if there is any nerve damage. Room 7 alone    HISTORY OF PRESENT ILLNESS:   46 year old female here for evaluation of left leg numbness.  Symptoms have been intermittent since January 2023.  Symptoms were affecting left hip, left shin and left ankle and top of the left foot.  She would feel sensation of foot falling asleep in certain positions.  Symptoms are intermittent for many months.  July 2023 symptoms started and then would not go away.  She went to the emergency room for evaluation.  CT scan of the head and lab testing were obtained which showed no acute findings except for iron deficiency anemia, which has been a chronic issue.  Since that time symptoms are slightly improved.  Now having numbness mainly on the anterior ankle and top of foot.  No problems with right leg.  No problems with fingers or arms.  Does have some chronic bilateral shoulder pain issues.    REVIEW OF SYSTEMS: Full 14 system review of systems performed and negative with exception of: as per HPI.  ALLERGIES: Allergies  Allergen Reactions   Penicillins Other (See Comments)    Swelling and hives    HOME MEDICATIONS: Outpatient Medications Prior to Visit  Medication Sig Dispense Refill   acetaminophen (TYLENOL) 500 MG tablet Take 2 tablets (1,000 mg total) by mouth every 6 (six) hours as needed. 30 tablet 0   b complex vitamins capsule Take 1 capsule by mouth daily.     calcium carbonate (OS-CAL) 1250 (500 Ca) MG chewable tablet Chew 1 tablet by mouth daily.     cyclobenzaprine (FLEXERIL) 5 MG tablet Take 1 tablet (5 mg total) by mouth 3 (three) times  daily as needed for muscle spasms. Do not drink alcohol or drive while taking this medication.  May cause drowsiness. 15 tablet 0   No facility-administered medications prior to visit.    PAST MEDICAL HISTORY: Past Medical History:  Diagnosis Date   Anemia    Depression    GERD (gastroesophageal reflux disease)     PAST SURGICAL HISTORY: Past Surgical History:  Procedure Laterality Date   BREAST EXCISIONAL BIOPSY     OTHER SURGICAL HISTORY     fibroids removed from breasts   TUBAL LIGATION      FAMILY HISTORY: Family History  Problem Relation Age of Onset   Diabetes Mother    Hypertension Mother    Breast cancer Mother    Breast cancer Maternal Grandmother    Anesthesia problems Neg Hx    Hypotension Neg Hx    Malignant hyperthermia Neg Hx    Pseudochol deficiency Neg Hx     SOCIAL HISTORY: Social History   Socioeconomic History   Marital status: Married    Spouse name: Not on file   Number of children: 2   Years of education: 13   Highest education level: Not on file  Occupational History   Occupation: unemployed  Tobacco Use   Smoking status: Every Day    Packs/day: 0.25    Years: 20.00    Total pack years: 5.00    Types: Cigarettes  Smokeless tobacco: Never  Vaping Use   Vaping Use: Never used  Substance and Sexual Activity   Alcohol use: Yes    Comment: Socially   Drug use: Yes    Types: Marijuana    Comment: Socially   Sexual activity: Yes    Partners: Male    Birth control/protection: Surgical  Other Topics Concern   Not on file  Social History Narrative   Not on file   Social Determinants of Health   Financial Resource Strain: Not on file  Food Insecurity: Not on file  Transportation Needs: Not on file  Physical Activity: Not on file  Stress: Not on file  Social Connections: Not on file  Intimate Partner Violence: Not on file     PHYSICAL EXAM  GENERAL EXAM/CONSTITUTIONAL: Vitals:  Vitals:   04/18/22 0956  BP: 101/67   Pulse: 65  Weight: 167 lb 8 oz (76 kg)  Height: 5\' 10"  (1.778 m)   Body mass index is 24.03 kg/m. Wt Readings from Last 3 Encounters:  04/18/22 167 lb 8 oz (76 kg)  04/13/22 168 lb (76.2 kg)  02/12/22 163 lb 3.2 oz (74 kg)   Patient is in no distress; well developed, nourished and groomed; neck is supple  CARDIOVASCULAR: Examination of carotid arteries is normal; no carotid bruits Regular rate and rhythm, no murmurs Examination of peripheral vascular system by observation and palpation is normal  EYES: Ophthalmoscopic exam of optic discs and posterior segments is normal; no papilledema or hemorrhages No results found.  MUSCULOSKELETAL: Gait, strength, tone, movements noted in Neurologic exam below  NEUROLOGIC: MENTAL STATUS:      No data to display         awake, alert, oriented to person, place and time recent and remote memory intact normal attention and concentration language fluent, comprehension intact, naming intact fund of knowledge appropriate  CRANIAL NERVE:  2nd - no papilledema on fundoscopic exam 2nd, 3rd, 4th, 6th - pupils equal and reactive to light, visual fields full to confrontation, extraocular muscles intact, no nystagmus 5th - facial sensation symmetric 7th - facial strength symmetric 8th - hearing intact 9th - palate elevates symmetrically, uvula midline 11th - shoulder shrug symmetric 12th - tongue protrusion midline  MOTOR:  normal bulk and tone, full strength in the BUE, BLE  SENSORY:  normal and symmetric to light touch, pinprick, temperature, vibration; EXCEPT DECR IN LEFT ANKLES / FOOT  COORDINATION:  finger-nose-finger, fine finger movements normal  REFLEXES:  deep tendon reflexes present and symmetric; TRACE AT ANKLES  GAIT/STATION:  narrow based gait     DIAGNOSTIC DATA (LABS, IMAGING, TESTING) - I reviewed patient records, labs, notes, testing and imaging myself where available.  Lab Results  Component Value Date    WBC 4.0 04/13/2022   HGB 11.2 (L) 04/13/2022   HCT 35.1 (L) 04/13/2022   MCV 86.7 04/13/2022   PLT 250 04/13/2022      Component Value Date/Time   NA 141 03/07/2022 2201   NA 141 02/15/2017 0953   K 3.9 03/07/2022 2201   CL 110 03/07/2022 2201   CO2 21 (L) 03/07/2022 2114   GLUCOSE 92 03/07/2022 2201   BUN 5 (L) 03/07/2022 2201   BUN 6 02/15/2017 0953   CREATININE 0.60 03/07/2022 2201   CALCIUM 8.7 (L) 03/07/2022 2114   PROT 6.4 (L) 03/07/2022 2114   PROT 6.8 02/15/2017 0953   ALBUMIN 3.6 03/07/2022 2114   ALBUMIN 3.9 02/15/2017 0953   AST 33 03/07/2022 2114  ALT 25 03/07/2022 2114   ALKPHOS 55 03/07/2022 2114   BILITOT 0.3 03/07/2022 2114   BILITOT <0.2 02/15/2017 0953   GFRNONAA >60 03/07/2022 2114   GFRAA 128 02/15/2017 0953   Lab Results  Component Value Date   CHOL 144 02/15/2017   HDL 61 02/15/2017   LDLCALC 73 02/15/2017   TRIG 52 02/15/2017   CHOLHDL 2.4 02/15/2017   Lab Results  Component Value Date   HGBA1C 5.8 02/15/2017   Lab Results  Component Value Date   VITAMINB12 286 02/10/2022   Lab Results  Component Value Date   TSH 1.366 04/13/2022    03/07/22 CT head [I reviewed images myself and agree with interpretation. -VRP]  - negative    ASSESSMENT AND PLAN  46 y.o. year old female here with:   Dx:  1. Left leg numbness      PLAN:  LEFT LEG NUMBNESS (intermittent since ~Jan 2023; now consistent since July 2023) - check MRI lumbar spine (tried stretching, exercises, tumeric, naprosyn, meloxicam, flexeril) - if negative, then may consider MRI brain (rule out demyelinating disease) - avoid leg crossing or other compression of left knee / ankle  Orders Placed This Encounter  Procedures   MR LUMBAR SPINE WO CONTRAST   Return for pending test results, pending if symptoms worsen or fail to improve.    Suanne Marker, MD 04/18/2022, 10:43 AM Certified in Neurology, Neurophysiology and Neuroimaging  [facility] [address], [address] [place], [place] [address] [phone]

## 2022-04-19 ENCOUNTER — Encounter: Payer: Self-pay | Admitting: Hematology and Oncology

## 2022-04-19 NOTE — Telephone Encounter (Signed)
Called and given below message from Dr. Bertis Ruddy. She verbalized understanding and will start the vitamin D.

## 2022-04-25 ENCOUNTER — Telehealth: Payer: Self-pay | Admitting: Diagnostic Neuroimaging

## 2022-04-25 NOTE — Telephone Encounter (Signed)
Pt scheduled for MRI lumbar spine wo contrast for 9/12 at 1:30pm at [facility] [phone] (04/25/22-06/23/22)

## 2022-05-01 ENCOUNTER — Other Ambulatory Visit: Payer: BC Managed Care – PPO

## 2022-12-27 ENCOUNTER — Ambulatory Visit (INDEPENDENT_AMBULATORY_CARE_PROVIDER_SITE_OTHER): Payer: BC Managed Care – PPO | Admitting: Family Medicine

## 2022-12-27 ENCOUNTER — Encounter: Payer: Self-pay | Admitting: Family Medicine

## 2022-12-27 VITALS — BP 122/75 | HR 70 | Temp 98.1°F | Ht 70.0 in | Wt 201.0 lb

## 2022-12-27 DIAGNOSIS — D500 Iron deficiency anemia secondary to blood loss (chronic): Secondary | ICD-10-CM | POA: Diagnosis not present

## 2022-12-27 DIAGNOSIS — Z13228 Encounter for screening for other metabolic disorders: Secondary | ICD-10-CM | POA: Diagnosis not present

## 2022-12-27 DIAGNOSIS — R7303 Prediabetes: Secondary | ICD-10-CM

## 2022-12-27 DIAGNOSIS — Z7689 Persons encountering health services in other specified circumstances: Secondary | ICD-10-CM

## 2022-12-27 DIAGNOSIS — Z1159 Encounter for screening for other viral diseases: Secondary | ICD-10-CM

## 2022-12-27 NOTE — Patient Instructions (Signed)
It was wonderful to see you today.  Please bring ALL of your medications with you to every visit.   Updates from today's visit:  We are checking some blood work today.  If everything is normal, I will send you a MyChart message.  If anything is abnormal, I will give you a call and we will talk about it.  Please follow up in 12 months   Thank you for choosing [facility].   Please call [phone] with any questions about today's appointment.  Please be sure to schedule follow up at the front  desk before you leave today.   Vonna Drafts, MD  Family Medicine

## 2022-12-27 NOTE — Progress Notes (Addendum)
   Subjective:  Patient ID: Ashley Ford, female    DOB: [birth year], 47 y.o.   MRN: [id number]  CC: New Patient  HPI:  Ashley Ford is a very pleasant 47 y.o. female who presents today to establish care.  Used to see Dr. Laural Benes with [facility] but hasn't seen in a while.  Denies significant headaches, vision changes, abdominal pain, CP/SOB, fevers  Has followed with multiple specialists in the past and wishes to have a home base for her medical care. Followed w/ [facility] for menorrhagia, heme/onc for anemia, ortho for shoulder pain, and fertility clinic.   PMHx: Past Medical History:  Diagnosis Date   Anemia    Depression    GERD (gastroesophageal reflux disease)    Menorrhagia    Prediabetes     Surgical Hx: Past Surgical History:  Procedure Laterality Date   BREAST EXCISIONAL BIOPSY     OTHER SURGICAL HISTORY     fibroids removed from breasts   TUBAL LIGATION      Family Hx: Family History  Problem Relation Age of Onset   Diabetes Mother    Hypertension Mother    Breast cancer Mother    Breast cancer Maternal Grandmother    Anesthesia problems Neg Hx    Hypotension Neg Hx    Malignant hyperthermia Neg Hx    Pseudochol deficiency Neg Hx     Social Hx: Current Social History   Who lives at home: lives alone  Who would speak for you about health care matters: husband Ashley Ford  Transportation: Drives  Work / Education:  On medical leave  Religious / Personal Beliefs: n/a   Smoking: 4 cigarettes/day for over 20 years   Medications: -none  Preventative Screening Pap test: In September with [facility] Tetanus vaccine: does not remember   ROS: pertinent noted in the HPI    Objective:  BP 122/75   Pulse 70   Temp 98.1 F (36.7 C)   Ht 5\' 10"  (1.778 m)   Wt 201 lb (91.2 kg)   LMP 12/24/2022   SpO2 100%   BMI 28.84 kg/m  Vitals and nursing note reviewed  General: NAD, pleasant, able to participate in exam Cardiac: RRR, no murmurs  auscultated Respiratory: CTAB, normal WOB Abdomen: soft, non-tender, non-distended, normoactive bowel sounds Extremities: warm and well perfused, no edema or cyanosis Skin: warm and dry, no rashes noted Neuro: alert, no obvious focal deficits, speech normal Psych: Normal affect and mood  Assessment & Plan:   1. Encounter to establish care Overall healthy, asymptomatic today, not on any medications. Will check screening bloodwork as below. Pt reports had pap smear recently in Oct 2023, will request records from [facility].  2. Screening for metabolic disorder - Comprehensive metabolic panel - Lipid Panel  3. Iron deficiency anemia due to chronic blood loss - CBC with Differential  4. Need for hepatitis C screening test - Hepatitis C antibody (reflex, frozen specimen)  5. Prediabetes - Hemoglobin A1c    Return in about 1 year (around 12/27/2023). Vonna Drafts, MD 12/27/2022, 3:57 PM PGY-1, [facility]

## 2022-12-28 LAB — CBC WITH DIFFERENTIAL/PLATELET
Basophils Absolute: 0 10*3/uL (ref 0.0–0.2)
Basos: 0 %
EOS (ABSOLUTE): 0.3 10*3/uL (ref 0.0–0.4)
Eos: 6 %
Hematocrit: 34.3 % (ref 34.0–46.6)
Hemoglobin: 11.4 g/dL (ref 11.1–15.9)
Immature Grans (Abs): 0 10*3/uL (ref 0.0–0.1)
Immature Granulocytes: 0 %
Lymphocytes Absolute: 1.9 10*3/uL (ref 0.7–3.1)
Lymphs: 32 %
MCH: 29.2 pg (ref 26.6–33.0)
MCHC: 33.2 g/dL (ref 31.5–35.7)
MCV: 88 fL (ref 79–97)
Monocytes Absolute: 0.6 10*3/uL (ref 0.1–0.9)
Monocytes: 10 %
Neutrophils Absolute: 3.2 10*3/uL (ref 1.4–7.0)
Neutrophils: 52 %
Platelets: 273 10*3/uL (ref 150–450)
RBC: 3.91 x10E6/uL (ref 3.77–5.28)
RDW: 14.1 % (ref 11.7–15.4)
WBC: 6 10*3/uL (ref 3.4–10.8)

## 2022-12-28 LAB — COMPREHENSIVE METABOLIC PANEL
ALT: 17 IU/L (ref 0–32)
AST: 25 IU/L (ref 0–40)
Albumin/Globulin Ratio: 1.4 (ref 1.2–2.2)
Albumin: 4 g/dL (ref 3.9–4.9)
Alkaline Phosphatase: 73 IU/L (ref 44–121)
BUN/Creatinine Ratio: 8 — ABNORMAL LOW (ref 9–23)
BUN: 6 mg/dL (ref 6–24)
Bilirubin Total: 0.2 mg/dL (ref 0.0–1.2)
CO2: 24 mmol/L (ref 20–29)
Calcium: 9.1 mg/dL (ref 8.7–10.2)
Chloride: 108 mmol/L — ABNORMAL HIGH (ref 96–106)
Creatinine, Ser: 0.72 mg/dL (ref 0.57–1.00)
Globulin, Total: 2.8 g/dL (ref 1.5–4.5)
Glucose: 83 mg/dL (ref 70–99)
Potassium: 4.7 mmol/L (ref 3.5–5.2)
Sodium: 145 mmol/L — ABNORMAL HIGH (ref 134–144)
Total Protein: 6.8 g/dL (ref 6.0–8.5)
eGFR: 106 mL/min/{1.73_m2} (ref >59)

## 2022-12-28 LAB — LIPID PANEL
Chol/HDL Ratio: 2.4 ratio (ref 0.0–4.4)
Cholesterol, Total: 170 mg/dL (ref 100–199)
HDL: 72 mg/dL (ref >39)
LDL Chol Calc (NIH): 87 mg/dL (ref 0–99)
Triglycerides: 57 mg/dL (ref 0–149)
VLDL Cholesterol Cal: 11 mg/dL (ref 5–40)

## 2022-12-28 LAB — HEMOGLOBIN A1C
Est. average glucose Bld gHb Est-mCnc: 114 mg/dL
Hgb A1c MFr Bld: 5.6 % (ref 4.8–5.6)

## 2022-12-28 LAB — HCV INTERPRETATION

## 2022-12-28 LAB — HCV AB W REFLEX TO QUANT PCR: HCV Ab: NONREACTIVE

## 2023-02-28 ENCOUNTER — Other Ambulatory Visit (HOSPITAL_COMMUNITY): Payer: Self-pay | Admitting: Obstetrics and Gynecology

## 2023-02-28 DIAGNOSIS — Z01818 Encounter for other preprocedural examination: Secondary | ICD-10-CM

## 2023-03-11 ENCOUNTER — Telehealth (HOSPITAL_COMMUNITY): Payer: Self-pay | Admitting: Obstetrics and Gynecology

## 2023-03-11 NOTE — Telephone Encounter (Signed)
Patient declined to schedule GXT that was ordered fur to she has no insurance and can't afford to have anymore testing. Order will be removed from the WQ. Thank you

## 2023-03-12 ENCOUNTER — Other Ambulatory Visit (HOSPITAL_COMMUNITY): Payer: Self-pay | Admitting: Obstetrics and Gynecology

## 2023-03-12 ENCOUNTER — Encounter (HOSPITAL_COMMUNITY): Payer: Self-pay | Admitting: Obstetrics and Gynecology

## 2023-03-12 DIAGNOSIS — Z01818 Encounter for other preprocedural examination: Secondary | ICD-10-CM

## 2023-09-07 ENCOUNTER — Ambulatory Visit (HOSPITAL_COMMUNITY)
Admission: EM | Admit: 2023-09-07 | Discharge: 2023-09-07 | Disposition: A | Discharge status: Home / Self Care | Payer: Medicaid Other | Attending: Emergency Medicine | Admitting: Emergency Medicine

## 2023-09-07 ENCOUNTER — Encounter: Payer: Self-pay | Admitting: Hematology and Oncology

## 2023-09-07 ENCOUNTER — Ambulatory Visit (INDEPENDENT_AMBULATORY_CARE_PROVIDER_SITE_OTHER): Payer: Medicaid Other

## 2023-09-07 ENCOUNTER — Telehealth (HOSPITAL_COMMUNITY): Payer: Self-pay | Admitting: Emergency Medicine

## 2023-09-07 ENCOUNTER — Encounter (HOSPITAL_COMMUNITY): Payer: Self-pay

## 2023-09-07 DIAGNOSIS — J069 Acute upper respiratory infection, unspecified: Secondary | ICD-10-CM

## 2023-09-07 DIAGNOSIS — R509 Fever, unspecified: Secondary | ICD-10-CM | POA: Diagnosis present

## 2023-09-07 DIAGNOSIS — N3001 Acute cystitis with hematuria: Secondary | ICD-10-CM

## 2023-09-07 DIAGNOSIS — N390 Urinary tract infection, site not specified: Secondary | ICD-10-CM

## 2023-09-07 LAB — POCT URINALYSIS DIP (MANUAL ENTRY)
Glucose, UA: NEGATIVE mg/dL — AB
Nitrite, UA: POSITIVE — AB
Protein Ur, POC: >=300 mg/dL — AB
Spec Grav, UA: 1.02 (ref 1.010–1.025)
Urobilinogen, UA: 1 U/dL
pH, UA: 5.5 (ref 5.0–8.0)

## 2023-09-07 LAB — POCT URINE PREGNANCY: Preg Test, Ur: NEGATIVE

## 2023-09-07 LAB — POC COVID19/FLU A&B COMBO
Covid Antigen, POC: NEGATIVE
Influenza A Antigen, POC: NEGATIVE
Influenza B Antigen, POC: NEGATIVE

## 2023-09-07 MED ORDER — DOXYCYCLINE HYCLATE 100 MG PO CAPS: 100.0000 mg | ORAL_CAPSULE | Freq: Two times a day (BID) | ORAL | 0 refills | Status: DC

## 2023-09-07 MED ORDER — SULFAMETHOXAZOLE-TRIMETHOPRIM 800-160 MG PO TABS: 1.0000 | ORAL_TABLET | Freq: Two times a day (BID) | ORAL | 0 refills | Status: AC

## 2023-09-07 MED ORDER — ACETAMINOPHEN 500 MG PO TABS: 1000.0000 mg | ORAL_TABLET | Freq: Four times a day (QID) | ORAL | 0 refills | Status: DC | PRN

## 2023-09-07 NOTE — Discharge Instructions (Addendum)
Chest xray and flu and covid are negative.   I have ordered antibiotic Bactrim for UTI - take as ordered  Tylenol 1000mg  three times a day for generalized body aches and fever Ensure hydration Consider adding on Zinc and Vit C for immunity    F/u with primary care if no improvement after antibiotic

## 2023-09-07 NOTE — ED Triage Notes (Signed)
Patient is here for fever. Cough and sinus pressure x 4 days.

## 2023-09-07 NOTE — Telephone Encounter (Signed)
Urine culture order corrected per lab tech request.

## 2023-09-07 NOTE — ED Provider Notes (Addendum)
[facility]    CSN: [id number] Arrival date & time: 09/07/23  1036      History   Chief Complaint Chief Complaint  Patient presents with   Facial Pain   Headache   Fever    HPI Ashley Ford is a 48 y.o. female.   Patient is here for fever  and fatigue x 7 days.  She is reporting right ear pain, headache, intermittent cough and congestion.  She has been taking aspirin at home.       Headache Pain location:  Generalized Quality:  Dull Associated symptoms: fever   Fever Associated symptoms: headaches     Past Medical History:  Diagnosis Date   Anemia    Depression    GERD (gastroesophageal reflux disease)    Irregular heart beat    Menorrhagia    Prediabetes     Patient Active Problem List   Diagnosis Date Noted   Iron deficiency anemia due to chronic blood loss 02/12/2022   Menorrhagia 02/12/2022   Vitamin D deficiency 02/12/2022   Prediabetes 02/15/2017   Obesity (BMI 30.0-34.9) 02/15/2017   Tobacco dependence 01/04/2017    Past Surgical History:  Procedure Laterality Date   BREAST EXCISIONAL BIOPSY     OTHER SURGICAL HISTORY     fibroids removed from breasts   TUBAL LIGATION  12/2000    OB History     Gravida  3   Para  2   Term  2   Preterm      AB  1   Living  2      SAB  1   IAB      Ectopic      Multiple      Live Births               Home Medications    Prior to Admission medications   Medication Sig Start Date End Date Taking? Authorizing Provider  sulfamethoxazole-trimethoprim (BACTRIM DS) 800-160 MG tablet Take 1 tablet by mouth 2 (two) times daily for 7 days. 09/07/23 09/14/23 Yes Prem Coykendall, Linde Gillis, NP  acetaminophen (TYLENOL) 500 MG tablet Take 2 tablets (1,000 mg total) by mouth every 6 (six) hours as needed for fever or headache. 09/07/23   Thedora Rings, Linde Gillis, NP  calcium carbonate (OS-CAL) 1250 (500 Ca) MG chewable tablet Chew 1 tablet by mouth daily.    [provider]    Family  History Family History  Problem Relation Age of Onset   Diabetes Mother    Hypertension Mother    Breast cancer Mother    Breast cancer Maternal Grandmother    Anesthesia problems Neg Hx    Hypotension Neg Hx    Malignant hyperthermia Neg Hx    Pseudochol deficiency Neg Hx     Social History Social History   Tobacco Use   Smoking status: Every Day    Current packs/day: 0.25    Average packs/day: 0.3 packs/day for 20.0 years (5.0 ttl pk-yrs)    Types: Cigarettes   Smokeless tobacco: Never  Vaping Use   Vaping status: Never Used  Substance Use Topics   Alcohol use: Yes    Comment: Socially   Drug use: Yes    Types: Marijuana    Comment: Socially     Allergies   Penicillins   Review of Systems Review of Systems  Constitutional:  Positive for fever.  Neurological:  Positive for headaches.     Physical Exam Triage Vital Signs ED Triage  Vitals [09/07/23 1135]  Encounter Vitals Group     BP 122/76     Systolic BP Percentile      Diastolic BP Percentile      Pulse Rate (!) 103     Resp 18     Temp 98.8 F (37.1 C)     Temp Source Oral     SpO2 97 %     Weight      Height      Head Circumference      Peak Flow      Pain Score      Pain Loc      Pain Education      Exclude from Growth Chart    No data found.  Updated Vital Signs BP 122/76 (BP Location: Left Arm)   Pulse (!) 103   Temp 98.8 F (37.1 C) (Oral)   Resp 18   LMP 08/22/2023   SpO2 97%   Visual Acuity Right Eye Distance:   Left Eye Distance:   Bilateral Distance:    Right Eye Near:   Left Eye Near:    Bilateral Near:     Physical Exam Vitals and nursing note reviewed.  Constitutional:      Appearance: She is ill-appearing.  HENT:     Right Ear: Drainage and tenderness present. Tympanic membrane is erythematous.     Left Ear: Tympanic membrane is erythematous.     Ears:     Comments: Reported drainage from right ear 4 days ago. TM is intact    Mouth/Throat:     Pharynx:  Posterior oropharyngeal erythema, uvula swelling and postnasal drip present.  Cardiovascular:     Rate and Rhythm: Regular rhythm. Tachycardia present.     Heart sounds: Normal heart sounds.  Lymphadenopathy:     Cervical: Cervical adenopathy present.  Neurological:     Mental Status: She is alert.      UC Treatments / Results  Labs (all labs ordered are listed, but only abnormal results are displayed) Labs Reviewed  POCT URINALYSIS DIP (MANUAL ENTRY) - Abnormal; Notable for the following components:      Result Value   Glucose, UA negative (*)    Bilirubin, UA small (*)    Ketones, POC UA trace (5) (*)    Blood, UA moderate (*)    Protein Ur, POC >=300 (*)    Nitrite, UA Positive (*)    Leukocytes, UA Large (3+) (*)    All other components within normal limits  POC COVID19/FLU A&B COMBO  POCT URINE PREGNANCY    EKG   Radiology DG Chest 2 View Result Date: 09/07/2023 CLINICAL DATA:  Fever and cough. EXAM: CHEST - 2 VIEW COMPARISON:  02/10/2022 FINDINGS: Lung are adequately inflated without focal airspace consolidation or effusion. Cardiomediastinal silhouette and remainder of the exam is unchanged. IMPRESSION: No active cardiopulmonary disease. Electronically Signed   By: Elberta Fortis M.D.   On: 09/07/2023 13:27    Procedures Procedures (including critical care time)  Medications Ordered in UC Medications - No data to display  Initial Impression / Assessment and Plan / UC Course  I have reviewed the triage vital signs and the nursing notes.  Pertinent labs & imaging results that were available during my care of the patient were reviewed by me and considered in my medical decision making (see chart for details).   Patient has been sick x 1 weeks she is not recovering as anticipated.  She feels weak and continues with  intermittent fevers.  She continues with headaches and fatigue.  On auscultation - right lung field is diminished.  Chest xray was completed and is  negative.  Covid and flu are also negative at this time.   She is requesting urine test as her urine is dark, which is d/t to her not drinking adequately during this illness with fever.  She is sexually active, so we will run pregnancy at this time to r/o an obscured reason for illness.    Urine tested to rule out other cause - Positive findings - Bactrim ordered .  Culture ordered     Final Clinical Impressions(s) / UC Diagnoses   Final diagnoses:  Fever, unspecified  Acute upper respiratory infection  Urinary tract infection without hematuria, site unspecified     Discharge Instructions      Chest xray and flu and covid are negative.   I have ordered antibiotic Bactrim for UTI - take as ordered  Tylenol 1000mg  three times a day for generalized body aches and fever Ensure hydration Consider adding on Zinc and Vit C for immunity    F/u with primary care if no improvement after antibiotic     ED Prescriptions     Medication Sig Dispense Auth. Provider   acetaminophen (TYLENOL) 500 MG tablet  (Status: Discontinued) Take 2 tablets (1,000 mg total) by mouth every 6 (six) hours as needed for fever or headache. 90 tablet Aeson Sawyers, Linde Gillis, NP   doxycycline (VIBRAMYCIN) 100 MG capsule  (Status: Discontinued) Take 1 capsule (100 mg total) by mouth 2 (two) times daily. 20 capsule Jamareon Shimel, Linde Gillis, NP   acetaminophen (TYLENOL) 500 MG tablet Take 2 tablets (1,000 mg total) by mouth every 6 (six) hours as needed for fever or headache. 90 tablet Caydin Yeatts, Linde Gillis, NP   sulfamethoxazole-trimethoprim (BACTRIM DS) 800-160 MG tablet Take 1 tablet by mouth 2 (two) times daily for 7 days. 14 tablet Jaxxon Naeem, Linde Gillis, NP      PDMP not reviewed this encounter.   Nelda Marseille, NP 09/07/23 1337    Nelda Marseille, NP 09/07/23 1417    Nelda Marseille, NP 09/07/23 1422

## 2023-09-09 LAB — URINE CULTURE: Culture: >=100000 — AB

## 2023-09-11 ENCOUNTER — Other Ambulatory Visit: Payer: Self-pay | Admitting: Physician Assistant

## 2023-09-11 DIAGNOSIS — Z1231 Encounter for screening mammogram for malignant neoplasm of breast: Secondary | ICD-10-CM

## 2023-09-26 ENCOUNTER — Other Ambulatory Visit: Payer: Self-pay | Admitting: Medical Genetics

## 2023-09-27 ENCOUNTER — Other Ambulatory Visit (HOSPITAL_COMMUNITY)
Admission: RE | Admit: 2023-09-27 | Discharge: 2023-09-27 | Disposition: A | Discharge status: Home / Self Care | Payer: Self-pay | Source: Ambulatory Visit | Attending: Medical Genetics | Admitting: Medical Genetics

## 2023-10-11 LAB — GENECONNECT MOLECULAR SCREEN: Genetic Analysis Overall Interpretation: NEGATIVE

## 2023-10-16 ENCOUNTER — Ambulatory Visit
Admission: RE | Admit: 2023-10-16 | Discharge: 2023-10-16 | Disposition: A | Discharge status: Home / Self Care | Payer: Medicaid Other | Source: Ambulatory Visit | Attending: Physician Assistant | Admitting: Physician Assistant

## 2023-10-16 DIAGNOSIS — Z1231 Encounter for screening mammogram for malignant neoplasm of breast: Secondary | ICD-10-CM

## 2023-10-28 ENCOUNTER — Telehealth: Payer: Self-pay | Admitting: Hematology and Oncology

## 2023-11-15 ENCOUNTER — Encounter: Payer: Self-pay | Admitting: Hematology and Oncology

## 2023-11-15 ENCOUNTER — Inpatient Hospital Stay: Attending: Hematology and Oncology | Admitting: Hematology and Oncology

## 2023-11-15 ENCOUNTER — Inpatient Hospital Stay

## 2023-11-15 VITALS — BP 107/75 | HR 84 | Temp 98.5°F | Resp 18 | Ht 70.0 in | Wt 197.2 lb

## 2023-11-15 DIAGNOSIS — D500 Iron deficiency anemia secondary to blood loss (chronic): Secondary | ICD-10-CM | POA: Diagnosis not present

## 2023-11-15 DIAGNOSIS — D75838 Other thrombocytosis: Secondary | ICD-10-CM

## 2023-11-15 DIAGNOSIS — N920 Excessive and frequent menstruation with regular cycle: Secondary | ICD-10-CM | POA: Diagnosis not present

## 2023-11-15 DIAGNOSIS — Z1589 Genetic susceptibility to other disease: Secondary | ICD-10-CM

## 2023-11-15 DIAGNOSIS — Z1501 Genetic susceptibility to malignant neoplasm of breast: Secondary | ICD-10-CM | POA: Insufficient documentation

## 2023-11-15 NOTE — Assessment & Plan Note (Addendum)
 We discussed importance of GYN follow-up for management of irregular menstruation

## 2023-11-15 NOTE — Progress Notes (Signed)
 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 We discussed some of the risks, benefits, and alternatives of intravenous iron infusions. The patient is symptomatic from anemia and the iron level is critically low. She tolerated oral iron supplement poorly and desires to achieved higher levels of iron faster for adequate hematopoesis. Some of the side-effects to be expected including risks of infusion reactions, phlebitis, headaches, nausea and fatigue.  The patient is willing to proceed. Patient education material was dispensed.  Goal is to keep ferritin level greater than 50 and resolution of anemia I recommend 2 doses of intravenous iron Feraheme She has appointment to see her primary care doctor in May and will inform me with test results We also discussed chronic oral iron supplement if she can tolerate to be taken 30 minutes before her meals Menorrhagia with regular cycle We discussed importance of GYN follow-up for management of irregular menstruation Reactive thrombocytosis Likely due to iron deficiency anemia I anticipate improvement after intravenous iron infusion    No  orders of the defined types were placed in this encounter.   INTERVAL HISTORY: Patient returns for recurrent anemia She was lost to follow-up after intravenous iron infusion in 2023 Symptoms of anemia includes fatigue We reviewed CBC, iron test results from January 2025 She also reviewed copy of her genetic test ordered by her gynecologist which show she is positive for PALB 2 I reviewed her referral note, outside records and blood work  SUMMARY OF HEMATOLOGIC HISTORY: She was originally seen as a new consult in 2023 She was found to have abnormal CBC from recent blood work On 02/10/2022, she was seen in the emergency department and noted to hemoglobin 7.1, MCV of 69 with confirm iron deficiency with iron studies.  Historically, CBC from 2008/03/17 showed hemoglobin of 11.1 with normal MCV According to the patient, she has been anemic all her life.  At 1 point, she stated her hemoglobin was less than 2 She denies recent chest pain on exertion, shortness of breath on minimal exertion, pre-syncopal episodes, or palpitations. She complains of fatigue and diffuse myalgias  She had not noticed any recent bleeding such as epistaxis, hematuria.  She has intermittent rectal bleeding due to constipation. She complained of menorrhagia; her menstrual cycle is variable, lasting up to 7 days and occasionally associated with passage of large amount of clots The patient takes over the counter NSAID. She is not on antiplatelets agents. She has never had colonoscopy.  She denies history of abnormal Pap smear She had no prior history or diagnosis of cancer. Her age appropriate screening programs are up-to-date. She denies any pica and eats a variety of diet. She never donated blood or  received blood transfusion The patient was prescribed oral iron supplements and she takes sporadically because it causes upset stomach. In July 2023, she received 4 doses of intravenous iron sucrose 400 mg She was lost to  follow-up.  Repeat blood work from her primary care doctor dated September 10, 2023 showed white blood cell count 7.5, hemoglobin 9.6, MCV 78.9, platelet count 481, low serum iron of 27, 8% saturation and ferritin of 29 She had colonoscopy in February 2025 which showed no source of GI bleed  Vitals:   11/15/23 1312  BP: 107/75  Pulse: 84  Resp: 18  Temp: 98.5 F (36.9 C)  SpO2: 100%

## 2023-11-15 NOTE — Assessment & Plan Note (Addendum)
 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

## 2023-11-15 NOTE — Assessment & Plan Note (Addendum)
 She was lost to follow-up She had recurrent iron deficiency anemia again based on her labs from January The most likely cause of her anemia is due to chronic blood loss/malabsorption syndrome. We discussed some of the risks, benefits, and alternatives of intravenous iron infusions. The patient is symptomatic from anemia and the iron level is critically low. She tolerated oral iron supplement poorly and desires to achieved higher levels of iron faster for adequate hematopoesis. Some of the side-effects to be expected including risks of infusion reactions, phlebitis, headaches, nausea and fatigue.  The patient is willing to proceed. Patient education material was dispensed.  Goal is to keep ferritin level greater than 50 and resolution of anemia I recommend 2 doses of intravenous iron Feraheme She has appointment to see her primary care doctor in May and will inform me with test results We also discussed chronic oral iron supplement if she can tolerate to be taken 30 minutes before her meals

## 2023-11-15 NOTE — Assessment & Plan Note (Addendum)
 Likely due to iron deficiency anemia I anticipate improvement after intravenous iron infusion

## 2023-11-18 ENCOUNTER — Telehealth: Payer: Self-pay

## 2023-11-18 NOTE — Telephone Encounter (Signed)
 Dr. Bertis Ruddy, patient will be scheduled as soon as possible.  Auth Submission: NO AUTH NEEDED Site of care: Site of care: [facility] Payer: [employer] medicaid Medication & CPT/J Code(s) submitted: Feraheme (ferumoxytol) [id number] Route of submission (phone, fax, portal): phone Phone # [phone] Fax # Auth type: Buy/Bill PB Units/visits requested: 510mg  x 2 doses Reference number: [email or web address] Approval from: 11/18/23 to 05/20/24

## 2023-11-20 ENCOUNTER — Ambulatory Visit (INDEPENDENT_AMBULATORY_CARE_PROVIDER_SITE_OTHER)

## 2023-11-20 VITALS — BP 116/80 | HR 64 | Temp 98.4°F | Resp 16 | Ht 70.0 in | Wt 197.6 lb

## 2023-11-20 DIAGNOSIS — N920 Excessive and frequent menstruation with regular cycle: Secondary | ICD-10-CM

## 2023-11-20 DIAGNOSIS — D500 Iron deficiency anemia secondary to blood loss (chronic): Secondary | ICD-10-CM

## 2023-11-20 MED ORDER — FERUMOXYTOL INJECTION 510 MG/17 ML
510.0000 mg | Freq: Once | INTRAVENOUS | Status: AC
  Administered 2023-11-20: 510 mg via INTRAVENOUS
  Filled 2023-11-20: qty 17

## 2023-11-20 MED ORDER — ACETAMINOPHEN 325 MG PO TABS
650.0000 mg | ORAL_TABLET | Freq: Once | ORAL | Status: AC
  Administered 2023-11-20: 650 mg via ORAL
  Filled 2023-11-20: qty 2

## 2023-11-20 NOTE — Progress Notes (Signed)
 Diagnosis: Iron Deficiency Anemia  Provider:  Chilton Greathouse MD  Procedure: IV Infusion  IV Type: Peripheral, IV Location: L Hand  Feraheme (Ferumoxytol), Dose: 510 mg  Infusion Start Time: 1630  Infusion Stop Time: 1645  Post Infusion IV Care: Observation period completed and Peripheral IV Discontinued  Discharge: Condition: Good, Destination: Home . AVS Declined  Performed by:  Loney Hering, LPN

## 2023-11-29 ENCOUNTER — Ambulatory Visit

## 2023-11-29 VITALS — BP 130/81 | HR 66 | Temp 98.9°F | Resp 18 | Ht 70.0 in | Wt 199.6 lb

## 2023-11-29 DIAGNOSIS — D500 Iron deficiency anemia secondary to blood loss (chronic): Secondary | ICD-10-CM

## 2023-11-29 DIAGNOSIS — N920 Excessive and frequent menstruation with regular cycle: Secondary | ICD-10-CM | POA: Diagnosis not present

## 2023-11-29 MED ORDER — FERUMOXYTOL INJECTION 510 MG/17 ML
510.0000 mg | Freq: Once | INTRAVENOUS | Status: AC
  Administered 2023-11-29: 510 mg via INTRAVENOUS
  Filled 2023-11-29: qty 17

## 2023-11-29 MED ORDER — ACETAMINOPHEN 325 MG PO TABS
650.0000 mg | ORAL_TABLET | Freq: Once | ORAL | Status: AC
Start: 2023-11-29 — End: 2023-11-29
  Administered 2023-11-29: 650 mg via ORAL
  Filled 2023-11-29: qty 2

## 2023-11-29 NOTE — Progress Notes (Signed)
 Diagnosis: Iron Deficiency Anemia  Provider:  Chilton Greathouse MD  Procedure: IV Infusion  IV Type: Peripheral, IV Location: L Forearm  Feraheme (Ferumoxytol), Dose: 510 mg  Infusion Start Time: 1446  Infusion Stop Time: 1502  Post Infusion IV Care: Observation period completed and Peripheral IV Discontinued  Discharge: Condition: Good, Destination: Home . AVS Declined  Performed by:  Rico Ala, LPN

## 2024-01-15 ENCOUNTER — Telehealth: Payer: Self-pay

## 2024-01-15 NOTE — Telephone Encounter (Signed)
 Called and given below message. She verbalized understanding. Sent scheduling message for 3 month appt.

## 2024-01-15 NOTE — Telephone Encounter (Signed)
 Called her back and told her that the thiamine vitamin B1 has been removed from her medication list. She verbalized understanding.

## 2024-01-15 NOTE — Telephone Encounter (Signed)
 Returned her call. After next appt with Dr. Marton Sleeper in 3 month if PCP will not order breast MRI then Dr. Marton Sleeper will order after next appt. She verbalized understanding.

## 2024-01-15 NOTE — Telephone Encounter (Signed)
-----   Message from Almeda Jacobs sent at 01/15/2024  8:44 AM EDT ----- I reviewed recent blood test done with PCP Looks like she is not anemic but iron  studies are borderline low I recommend repeat labs and see me in 3 months and if she agrees, send scheduling msg

## 2024-02-29 ENCOUNTER — Encounter: Payer: Self-pay | Admitting: Hematology and Oncology

## 2024-03-02 ENCOUNTER — Telehealth: Payer: Self-pay | Admitting: Hematology and Oncology

## 2024-03-02 NOTE — Telephone Encounter (Signed)
 Left patient a vm regarding upcoming appointment

## 2024-03-18 ENCOUNTER — Ambulatory Visit: Admitting: Orthopaedic Surgery

## 2024-03-18 DIAGNOSIS — M25512 Pain in left shoulder: Secondary | ICD-10-CM

## 2024-03-18 DIAGNOSIS — G8929 Other chronic pain: Secondary | ICD-10-CM

## 2024-03-18 DIAGNOSIS — M25511 Pain in right shoulder: Secondary | ICD-10-CM | POA: Diagnosis not present

## 2024-03-18 DIAGNOSIS — M542 Cervicalgia: Secondary | ICD-10-CM | POA: Diagnosis not present

## 2024-03-18 NOTE — Progress Notes (Signed)
 Office Visit Note   Patient: Ashley Ford           Date of Birth: 1976/06/08           MRN: [id number] Visit Date: 03/18/2024              Requested by: Rosalea Rosina LOISE, PA [address] [place],  [place] [address] PCP: Rosalea Rosina LOISE, PA   Assessment & Plan: Visit Diagnoses:  1. Chronic pain of both shoulders   2. Neck pain     Plan: History of Present Illness Ashley Ford is a 48 year old female with bilateral shoulder arthritis and bone spurs who presents with neck and bilateral shoulder pain.  She experiences neck and bilateral shoulder pain with numbness, tingling, and burning sensations radiating from her shoulders to her wrists. These symptoms have impaired her ability to close her hands, with numbness and tingling beginning approximately two days ago. There is no numbness or tingling in her fingers.  MRIs of both shoulders at [facility] revealed arthritis and acromioclavicular bone spurs. No imaging of her neck has been performed.  She has not participated in physical therapy due to being laid off from work.  Physical Exam NECK: Neck non-tender. MUSCULOSKELETAL: Limited range of motion in shoulders. Tenderness over AC joints. NEUROLOGICAL: Reflexes normal. Sensation normal. Strength normal in BUE.  Assessment and Plan Bilateral shoulder pain, trapezius myofascial syndrome. Chronic bilateral shoulder pain with possible acromioclavicular arthritis and bone spurs. Symptoms suggest muscular or metabolic. - Patient to obtain MRI scans and reports from [facility]. - Initiate physical therapy for shoulders and neck.  Muscular pain of the upper back and neck (trapezius myalgia) Muscular pain in the upper back and neck, likely trapezius myalgia. Not a surgical problem, more muscular. - Initiate physical therapy for shoulders and neck.  Upper extremity numbness and tingling, etiology unclear Recent onset of numbness and tingling from shoulders to  wrists. Symptoms do not suggest a primary shoulder problem. - Order nerve study of upper extremities.  Low hemoglobin and vitamin D  levels Low hemoglobin and vitamin D  levels noted. She is under hematologist care and receiving treatment.  Follow-Up Instructions: No follow-ups on file.   Orders:  Orders Placed This Encounter  Procedures   Ambulatory referral to Physical Therapy   Ambulatory referral to Physical Medicine Rehab   No orders of the defined types were placed in this encounter.     Procedures: No procedures performed   Clinical Data: No additional findings.   Subjective: Chief Complaint  Patient presents with   Neck - Pain   Right Shoulder - Pain   Left Shoulder - Pain    HPI  Review of Systems  Constitutional: Negative.   HENT: Negative.    Eyes: Negative.   Respiratory: Negative.    Cardiovascular: Negative.   Endocrine: Negative.   Musculoskeletal: Negative.   Neurological: Negative.   Hematological: Negative.   Psychiatric/Behavioral: Negative.    All other systems reviewed and are negative.    Objective: Vital Signs: There were no vitals taken for this visit.  Physical Exam Vitals and nursing note reviewed.  Constitutional:      Appearance: She is well-developed.  HENT:     Head: Atraumatic.     Nose: Nose normal.  Eyes:     Extraocular Movements: Extraocular movements intact.  Cardiovascular:     Pulses: Normal pulses.  Pulmonary:     Effort: Pulmonary effort is normal.  Abdominal:  Palpations: Abdomen is soft.  Musculoskeletal:     Cervical back: Neck supple.  Skin:    General: Skin is warm.     Capillary Refill: Capillary refill takes less than 2 seconds.  Neurological:     Mental Status: She is alert. Mental status is at baseline.  Psychiatric:        Behavior: Behavior normal.        Thought Content: Thought content normal.        Judgment: Judgment normal.     Ortho Exam  Specialty Comments:  No specialty  comments available.  Imaging: No results found.   PMFS History: Patient Active Problem List   Diagnosis Date Noted   PALB2 gene mutation positive 11/15/2023   Reactive thrombocytosis 11/15/2023   Iron  deficiency anemia due to chronic blood loss 02/12/2022   Menorrhagia 02/12/2022   Vitamin D  deficiency 02/12/2022   Prediabetes 02/15/2017   Obesity (BMI 30.0-34.9) 02/15/2017   Tobacco dependence 01/04/2017   Past Medical History:  Diagnosis Date   Anemia    Depression    GERD (gastroesophageal reflux disease)    Irregular heart beat    Menorrhagia    Prediabetes     Family History  Problem Relation Age of Onset   Diabetes Mother    Hypertension Mother    Breast cancer Mother    Breast cancer Maternal Grandmother    Anesthesia problems Neg Hx    Hypotension Neg Hx    Malignant hyperthermia Neg Hx    Pseudochol deficiency Neg Hx     Past Surgical History:  Procedure Laterality Date   BREAST EXCISIONAL BIOPSY     OTHER SURGICAL HISTORY     fibroids removed from breasts   TUBAL LIGATION  12/2000   Social History   Occupational History   Occupation: unemployed  Tobacco Use   Smoking status: Every Day    Current packs/day: 0.25    Average packs/day: 0.3 packs/day for 20.0 years (5.0 ttl pk-yrs)    Types: Cigarettes   Smokeless tobacco: Never  Vaping Use   Vaping status: Never Used  Substance and Sexual Activity   Alcohol use: Yes    Comment: Socially   Drug use: Yes    Types: Marijuana    Comment: Socially   Sexual activity: Yes    Partners: Male    Birth control/protection: Surgical

## 2024-03-21 ENCOUNTER — Ambulatory Visit: Attending: Orthopaedic Surgery | Admitting: Physical Therapy

## 2024-03-21 NOTE — Therapy (Incomplete)
 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  PERTINENT HISTORY: Reactive thrombocytosis, Prediabetes  WEIGHT BEARING RESTRICTIONS: {Yes ***/No:24003}  FALLS:  Has patient fallen in last 6 months? {fallsyesno:27318}  LIVING ENVIRONMENT: Lives with: {[facility] lives with:25569::lives with their family} Lives in: {Lives in:25570} Stairs: {opstairs:27293} Has following equipment at home: {Assistive devices:23999}  OCCUPATION: ***   PRECAUTIONS: {Therapy precautions:24002} ---------------------------------------------------------------------------------------------  SUBJECTIVE:                                                                                                                                                                                                         SUBJECTIVE STATEMENT: Eval statement 03/21/2024:  *** Hand dominance: {MISC; OT HAND DOMINANCE:[phone]}  RED FLAGS: {PT Red Flags:29287}   PLOF: {PLOF:24004}  PATIENT GOALS: ***  NEXT MD VISIT: *** ---------------------------------------------------------------------------------------------  OBJECTIVE:  Note: Objective measures were completed at Evaluation unless otherwise noted.  DIAGNOSTIC FINDINGS:  ***  PATIENT SURVEYS:  {rehab surveys:24030}  COGNITION: Overall cognitive status: {cognition:24006}  SENSATION: {sensation:27233}  POSTURE: {posture:25561}  PALPATION: ***   CERVICAL ROM:   {AROM/PROM:27142} ROM A/PROM (deg) eval  Flexion   Extension   Right lateral flexion   Left lateral flexion   Right rotation   Left rotation    (Blank rows = not tested)  ! Indicates pain with testing  UPPER EXTREMITY ROM:  {AROM/PROM:27142} ROM Right eval Left eval  Shoulder flexion    Shoulder extension  Shoulder abduction    Shoulder adduction    Shoulder extension    Shoulder internal rotation    Shoulder external rotation    Elbow flexion    Elbow extension    Wrist flexion    Wrist extension    Wrist ulnar deviation    Wrist radial deviation    Wrist pronation    Wrist supination     (Blank rows = not tested) ! Indicates pain with testing  UPPER EXTREMITY MMT:  MMT Right eval Left eval  Shoulder flexion    Shoulder extension    Shoulder abduction    Shoulder adduction    Shoulder extension    Shoulder internal rotation    Shoulder external rotation    Middle trapezius    Lower trapezius    Elbow flexion    Elbow extension    Wrist flexion    Wrist extension    Wrist ulnar deviation    Wrist radial deviation    Wrist pronation    Wrist supination    Grip strength     (Blank rows = not tested)  ! Indicates pain with testing   CERVICAL SPECIAL TESTS:  {Cervical special tests:25246}  FUNCTIONAL TESTS:  {Functional tests:24029}  [facility] Adult PT Treatment:                                                 DATE: 03/21/2024  Therapeutic Exercise: *** Manual Therapy: *** Neuromuscular re-ed: *** Therapeutic Activity: *** Modalities: *** Self Care: ***                                                                                                                              PATIENT EDUCATION:  Education details: Pt received education regarding HEP performance, ADL performance, functional activity tolerance, impairment education, appropriate performance of therapeutic activities.*** Person educated: {Person educated:25204} Education method: {Education Method:25205} Education comprehension: {Education Comprehension:25206}  HOME EXERCISE PROGRAM: *** ---------------------------------------------------------------------------------------------  ASSESSMENT:  CLINICAL IMPRESSION:  Eval impression (03/21/2024): Pt. attended today's physical therapy session for evaluation of ***. Pt has complaints of ***. Pt has notable deficits with ***.  Signs and symptoms are concurrent with ***. Pt would benefit from therapeutic focus on ***.  Treatment performed today focused on *** Pt demonstrated *** understanding of education provided. required *** cues and *** assistance for appropriate performance with today's activities. Pt requires the intervention of skilled outpatient physical therapy to address the aforementioned deficits and progress towards a functional level in line with therapeutic goals.    OBJECTIVE IMPAIRMENTS: {opptimpairments:25111}.   ACTIVITY LIMITATIONS: {activitylimitations:27494}  PARTICIPATION LIMITATIONS: {participationrestrictions:25113}  PERSONAL FACTORS: {Personal factors:25162} are also affecting patient's functional outcome.   REHAB POTENTIAL: {rehabpotential:25112}  CLINICAL DECISION MAKING: {clinical decision making:25114}  EVALUATION COMPLEXITY: {Evaluation complexity:25115}   GOALS: Goals reviewed with patient?  {yes/no:20286}  SHORT TERM GOALS: Target date: ***  Pt will be independent with administered HEP to demonstrate the competency necessary for long term managemnet of symptoms at home.  Baseline:  Goal status: INITIAL  2.  *** Baseline:  Goal status: INITIAL  3.  *** Baseline:  Goal status: INITIAL  4.  *** Baseline:  Goal status: INITIAL  5.  *** Baseline:  Goal status: INITIAL  6.  *** Baseline:  Goal status: INITIAL  LONG TERM GOALS: Target date: ***  Pt. Will achieve a NDI score of *** as to demonstrate improvement in self-perceived functional ability with daily activities. Baseline:  Goal status: INITIAL  2.  *** Baseline:  Goal status: INITIAL  3.  *** Baseline:  Goal status: INITIAL  4.  *** Baseline:  Goal status: INITIAL  5.  *** Baseline:  Goal status: INITIAL  6.  *** Baseline:  Goal status: INITIAL ---------------------------------------------------------------------------------------------  PLAN:  PT FREQUENCY: 1-2x/week  PT DURATION: 6 weeks  PLANNED INTERVENTIONS: 97110-Therapeutic exercises, 97530- Therapeutic activity, 97112- Neuromuscular re-education, 97535- Self Care, 02859- Manual therapy, Patient/Family education, Taping, Joint mobilization, Spinal mobilization, Cryotherapy, and Moist heat  PLAN FOR NEXT SESSION: review HEP, Begin POC as detailed In assessment.   Mabel JONELLE Kiang, PT 03/21/2024, 7:47 AM

## 2024-03-31 ENCOUNTER — Other Ambulatory Visit: Payer: Self-pay | Admitting: Hematology and Oncology

## 2024-03-31 DIAGNOSIS — D500 Iron deficiency anemia secondary to blood loss (chronic): Secondary | ICD-10-CM

## 2024-04-02 ENCOUNTER — Inpatient Hospital Stay: Attending: Hematology and Oncology

## 2024-04-02 ENCOUNTER — Inpatient Hospital Stay (HOSPITAL_BASED_OUTPATIENT_CLINIC_OR_DEPARTMENT_OTHER): Admitting: Hematology and Oncology

## 2024-04-02 ENCOUNTER — Encounter: Payer: Self-pay | Admitting: Hematology and Oncology

## 2024-04-02 VITALS — BP 118/71 | HR 85 | Temp 99.2°F | Resp 18 | Ht 70.0 in | Wt 204.2 lb

## 2024-04-02 DIAGNOSIS — D500 Iron deficiency anemia secondary to blood loss (chronic): Secondary | ICD-10-CM | POA: Diagnosis not present

## 2024-04-02 DIAGNOSIS — Z1589 Genetic susceptibility to other disease: Secondary | ICD-10-CM

## 2024-04-02 DIAGNOSIS — N920 Excessive and frequent menstruation with regular cycle: Secondary | ICD-10-CM | POA: Diagnosis present

## 2024-04-02 DIAGNOSIS — Z1501 Genetic susceptibility to malignant neoplasm of breast: Secondary | ICD-10-CM | POA: Diagnosis not present

## 2024-04-02 LAB — CBC WITH DIFFERENTIAL (CANCER CENTER ONLY)
Abs Immature Granulocytes: 0.05 K/uL (ref 0.00–0.07)
Basophils Absolute: 0 K/uL (ref 0.0–0.1)
Basophils Relative: 1 %
Eosinophils Absolute: 0.3 K/uL (ref 0.0–0.5)
Eosinophils Relative: 5 %
HCT: 37.3 % (ref 36.0–46.0)
Hemoglobin: 12.8 g/dL (ref 12.0–15.0)
Immature Granulocytes: 1 %
Lymphocytes Relative: 29 %
Lymphs Abs: 1.5 K/uL (ref 0.7–4.0)
MCH: 32.2 pg (ref 26.0–34.0)
MCHC: 34.3 g/dL (ref 30.0–36.0)
MCV: 94 fL (ref 80.0–100.0)
Monocytes Absolute: 0.5 K/uL (ref 0.1–1.0)
Monocytes Relative: 9 %
Neutro Abs: 2.9 K/uL (ref 1.7–7.7)
Neutrophils Relative %: 55 %
Platelet Count: ADEQUATE K/uL (ref 150–400)
RBC: 3.97 MIL/uL (ref 3.87–5.11)
RDW: 13.3 % (ref 11.5–15.5)
WBC Count: 5.2 K/uL (ref 4.0–10.5)
nRBC: 0 % (ref 0.0–0.2)

## 2024-04-02 LAB — IRON AND IRON BINDING CAPACITY (CC-WL,HP ONLY)
Iron: 98 ug/dL (ref 28–170)
Saturation Ratios: 23 % (ref 10.4–31.8)
TIBC: 428 ug/dL (ref 250–450)
UIBC: 330 ug/dL (ref 148–442)

## 2024-04-02 LAB — FERRITIN: Ferritin: 14 ng/mL (ref 11–307)

## 2024-04-02 LAB — VITAMIN B12: Vitamin B-12: 224 pg/mL (ref 180–914)

## 2024-04-02 NOTE — Assessment & Plan Note (Addendum)
 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

## 2024-04-02 NOTE — Assessment & Plan Note (Addendum)
 The cause of her anemia likely due to menorrhagia Since she received intravenous iron infusion, anemia has resolved Iron studies are pending I will see her again in 6 months for further follow-up

## 2024-04-02 NOTE — Progress Notes (Signed)
 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 that could put her at risk of breast cancer I recommend screening mammogram alternate with MRI of her breast every 6 months She just completed mammogram screening in February and would be due for MRI of her breast in August I have ordered it I told the patient she can get her MRI order through MRI primary care doctor but the patient wants to follow-up here I will call her with test results I will see her again in 6 months for further follow-up Iron deficiency anemia due to chronic blood loss The cause of her anemia likely due to menorrhagia Since she received intravenous iron infusion, anemia has resolved Iron studies are pending I will see her again in 6 months for further follow-up    Orders Placed This Encounter  Procedures   MR Breast Bilateral Wo Contrast    Standing Status:   Future    Expiration Date:   04/02/2025    What is the patient's sedation requirement?:   No Sedation    Does the patient have a pacemaker or implanted devices?:   No    Preferred imaging location?:   [facility] (table limit - 500lbs)   CBC with Differential (Cancer Center Only)    Standing Status:   Future    Expiration Date:   04/02/2025   Ferritin    Standing Status:   Future    Expiration Date:   04/02/2025   Iron and Iron Binding Capacity ([facility] only)    Standing Status:   Future    Expiration Date:   04/02/2025    INTERVAL HISTORY: Patient returns for recurrent anemia as well as positive for PALB2 gene mutation with increased susceptibility for breast cancer Symptoms of anemia includes fatigue We reviewed CBC results She has no concerns for breast cancer We discussed follow-up  SUMMARY OF HEMATOLOGIC HISTORY:  She was originally seen as a new  consult in 2023 She was found to have abnormal CBC from recent blood work On 02/10/2022, she was seen in the emergency department and noted to hemoglobin 7.1, MCV of 69 with confirm iron deficiency with iron studies.  Historically, CBC from 2008/03/18 showed hemoglobin of 11.1 with normal MCV According to the patient, she has been anemic all her life.  At 1 point, she stated her hemoglobin was less than 2 She denies recent chest pain on exertion, shortness of breath on minimal exertion, pre-syncopal episodes, or palpitations. She complains of fatigue and diffuse myalgias  She had not noticed any recent bleeding such as epistaxis, hematuria.  She has intermittent rectal bleeding due to constipation. She complained of menorrhagia; her menstrual cycle is variable, lasting up to 7 days and occasionally associated with passage of large amount of clots The patient takes over the counter NSAID. She is not on antiplatelets agents. She has never had colonoscopy.  She denies history of abnormal Pap smear She had no prior history or diagnosis of cancer. Her age appropriate screening programs are up-to-date. She denies any pica and eats a variety of diet. She never donated blood or received blood transfusion The patient was prescribed oral iron supplements and she takes sporadically because it causes upset stomach. In July 2023, she received 4 doses of intravenous iron sucrose 400 mg She was lost to  follow-up.  Repeat blood work from her primary care doctor dated September 10, 2023 showed white blood cell count 7.5, hemoglobin 9.6, MCV 78.9, platelet count 481, low serum iron of 27, 8% saturation and ferritin of 29 She had colonoscopy in February 2025 which showed no source of GI bleed She received 2 doses of intravenous iron in April 2025  Lab Results  Component Value Date   VITAMINB12 286 02/10/2022   FERRITIN 8 (L) 04/13/2022   Vitals:   04/02/24 1116  BP: 118/71  Pulse: 85  Resp: 18  Temp: 99.2 F  (37.3 C)  SpO2: 100%

## 2024-04-08 ENCOUNTER — Ambulatory Visit (HOSPITAL_COMMUNITY): Admission: RE | Admit: 2024-04-08 | Source: Ambulatory Visit

## 2024-04-08 ENCOUNTER — Other Ambulatory Visit: Payer: Self-pay | Admitting: Hematology and Oncology

## 2024-04-08 DIAGNOSIS — Z1589 Genetic susceptibility to other disease: Secondary | ICD-10-CM

## 2024-04-10 ENCOUNTER — Ambulatory Visit (HOSPITAL_COMMUNITY)
Admission: RE | Admit: 2024-04-10 | Discharge: 2024-04-10 | Disposition: A | Discharge status: Home / Self Care | Source: Ambulatory Visit | Attending: Hematology and Oncology | Admitting: Hematology and Oncology

## 2024-04-10 DIAGNOSIS — Z1589 Genetic susceptibility to other disease: Secondary | ICD-10-CM | POA: Insufficient documentation

## 2024-04-10 MED ORDER — GADOBUTROL 1 MMOL/ML IV SOLN
9.0000 mL | Freq: Once | INTRAVENOUS | Status: AC | PRN
  Administered 2024-04-10: 9 mL via INTRAVENOUS

## 2024-04-13 ENCOUNTER — Telehealth: Payer: Self-pay

## 2024-04-13 ENCOUNTER — Ambulatory Visit: Payer: Self-pay | Admitting: Hematology and Oncology

## 2024-04-13 NOTE — Telephone Encounter (Signed)
 Returned patient's call. Reiterated results and provided callback number should patient have any questions.

## 2024-04-13 NOTE — Telephone Encounter (Signed)
 LVM for patient regarding recent MRI results. Provided callback number should patient have any questions.

## 2024-04-13 NOTE — Telephone Encounter (Signed)
-----   Message from Almarie Bedford sent at 04/13/2024 11:34 AM EDT ----- MRI is normal please let her know ----- Message ----- From: Interface, Rad Results In Sent: 04/13/2024  11:32 AM EDT To: Almarie Bedford, MD

## 2024-04-13 NOTE — Telephone Encounter (Signed)
 Attempted to call patient on primary number. Patient not available at this time. Will attempted to call back later in the day.

## 2024-04-24 ENCOUNTER — Ambulatory Visit: Admitting: Physical Medicine and Rehabilitation

## 2024-04-24 DIAGNOSIS — M79642 Pain in left hand: Secondary | ICD-10-CM | POA: Diagnosis not present

## 2024-04-24 DIAGNOSIS — G8929 Other chronic pain: Secondary | ICD-10-CM

## 2024-04-24 DIAGNOSIS — D508 Other iron deficiency anemias: Secondary | ICD-10-CM | POA: Insufficient documentation

## 2024-04-24 DIAGNOSIS — R29898 Other symptoms and signs involving the musculoskeletal system: Secondary | ICD-10-CM

## 2024-04-24 DIAGNOSIS — M25511 Pain in right shoulder: Secondary | ICD-10-CM

## 2024-04-24 DIAGNOSIS — R202 Paresthesia of skin: Secondary | ICD-10-CM

## 2024-04-24 DIAGNOSIS — E786 Lipoprotein deficiency: Secondary | ICD-10-CM | POA: Insufficient documentation

## 2024-04-24 DIAGNOSIS — M79641 Pain in right hand: Secondary | ICD-10-CM

## 2024-04-24 DIAGNOSIS — M542 Cervicalgia: Secondary | ICD-10-CM

## 2024-04-24 DIAGNOSIS — Z5901 Sheltered homelessness: Secondary | ICD-10-CM | POA: Insufficient documentation

## 2024-04-24 DIAGNOSIS — M25512 Pain in left shoulder: Secondary | ICD-10-CM

## 2024-04-24 NOTE — Progress Notes (Signed)
 Pain Scale   Average Pain 6 Patient advising she has bilateral hand pain and numbness, tingling. Pain is constant and she also advises she has weakness. Patient advising she is Right hand dominate        +Driver, -BT, -Dye Allergies.

## 2024-04-24 NOTE — Progress Notes (Signed)
 Ashley Ford - 48 y.o. female MRN [id number]  Date of birth: July 02, 1976  Office Visit Note: Visit Date: 04/24/2024 PCP: Rosalea Rosina LOISE, PA Referred by: Jerri Kay HERO, MD  Subjective: Chief Complaint  Patient presents with   Right Hand - Numbness, Pain, Weakness   Left Hand - Pain, Numbness, Weakness   HPI: Ashley Ford is a 48 y.o. female who comes in today at the request of Dr. Ozell Jerri for evaluation and management of chronic, worsening and severe pain, numbness and tingling in the Bilateral upper extremities.  Patient is Right hand dominant.  She reports months of chronic neck pain with referral pattern into both shoulders and down both arms into both hands.  Does not really endorse 1 hand worse than the other.  She does get paresthesias numbness and tingling somewhat globally in both hands and has difficulty gripping and opening and closing the hands and they feel stiff.  She does report focal weakness.  She has had no prior cervical surgery or MRI of the cervical spine.  No prior history of electrodiagnostic study.  She is not diabetic and has no history of polyneuropathy.  She does get some more nocturnal complaints but is more constant now.  She has tried some anti-inflammatory medicines without help.  No physical therapy.   I spent more than 30 minutes speaking face-to-face with the patient with 50% of the time in counseling and discussing coordination of care.       Review of Systems  Musculoskeletal:  Positive for joint pain and neck pain.  Neurological:  Positive for tingling and focal weakness.  All other systems reviewed and are negative.  Otherwise per HPI.  Assessment & Plan: Visit Diagnoses:    ICD-10-CM   1. Paresthesia of skin  R20.2 NCV with EMG (electromyography)    2. Pain in left hand  M79.642     3. Pain in right hand  M79.641     4. Weakness of both hands  R29.898     5. Cervicalgia  M54.2     6. Chronic pain of both shoulders  M25.511     G89.29    M25.512        Plan: Impression: She could have underlying left more than right carpal tunnel syndrome versus cervical radiculopathy radiculitis.  Electrodiagnostic study performed today.  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 There is no significant electrodiagnostic evidence of any other focal nerve entrapment, brachial plexopathy or cervical radiculopathy.   Recommendations: 1.  Follow-up with referring physician. 2.  Continue current management of symptoms. 3.  Continue use of resting splint at night-time and as needed during the day.   Meds & Orders: No orders of the defined types were placed in this encounter.   Orders Placed This Encounter  Procedures   NCV with EMG (electromyography)    Follow-up: Return for  Ozell Jerri, MD.   Procedures: No procedures performed  EMG & NCV Findings: Evaluation of the right median (across palm) sensory nerve showed prolonged distal peak latency (Wrist, 4.1 ms).  All remaining nerves (as indicated in the following tables) were within normal limits.  All left vs. right side differences were within normal limits.    All examined muscles (as indicated in the following table) showed no evidence of electrical instability.    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 There is no significant electrodiagnostic evidence of any other focal nerve entrapment, brachial plexopathy or cervical radiculopathy.   Recommendations: 1.  Follow-up with referring physician. 2.  Continue current management of symptoms. 3.  Continue use of resting splint at night-time and as needed during the day.  ___________________________ Prentice Eldonna BETTERS Board Certified, American Board  of Physical Medicine and Rehabilitation    Nerve Conduction Studies Anti Sensory Summary Table   Stim Site NR Peak (ms) Norm Peak (ms) P-T Amp (V) Norm P-T Amp Site1 Site2 Delta-P (ms) Dist (cm) Vel (m/s) Norm Vel (m/s)  Left Median Acr Palm Anti Sensory (2nd Digit)  30.1C  Wrist    3.6 <3.6 30.4 >10 Wrist Palm 1.7 0.0    Palm    1.9 <2.0 20.7         Right Median Acr Palm Anti Sensory (2nd Digit)  29.7C  Wrist    *4.1 <3.6 18.7 >10 Wrist Palm 2.2 0.0    Palm    1.9 <2.0 9.5         Left Radial Anti Sensory (Base 1st Digit)  30C  Wrist    2.1 <3.1 32.5  Wrist Base 1st Digit 2.1 0.0    Right Radial Anti Sensory (Base 1st Digit)  30C  Wrist    2.0 <3.1 43.5  Wrist Base 1st Digit 2.0 0.0    Left Ulnar Anti Sensory (5th Digit)  30.3C  Wrist    3.4 <3.7 19.1 >15.0 Wrist 5th Digit 3.4 14.0 41 >38  Right Ulnar Anti Sensory (5th Digit)  30.2C  Wrist    3.3 <3.7 22.2 >15.0 Wrist 5th Digit 3.3 14.0 42 >38   Motor Summary Table   Stim Site NR Onset (ms) Norm Onset (ms) O-P Amp (mV) Norm O-P Amp Site1 Site2 Delta-0 (ms) Dist (cm) Vel (m/s) Norm Vel (m/s)  Left Median Motor (Abd Poll Brev)  30.4C  Wrist    3.9 <4.2 10.4 >5 Elbow Wrist 4.5 24.0 53 >50  Elbow    8.4  10.6         Right Median Motor (Abd Poll Brev)  30.3C  Wrist    4.0 <4.2 10.3 >5 Elbow Wrist 4.3 22.5 52 >50  Elbow    8.3  10.0         Left Ulnar Motor (Abd Dig Min)  30.5C  Wrist    3.1 <4.2 8.8 >3 B Elbow Wrist 3.7 20.0 54 >53  B Elbow    6.8  7.6  A Elbow B Elbow 1.6 10.0 62 >53  A Elbow    8.4  6.7         Right Ulnar Motor (Abd Dig Min)  30.6C  Wrist    3.4 <4.2 7.3 >3 B Elbow Wrist 3.9 21.0 54 >53  B Elbow    7.3  6.3  A Elbow B Elbow 1.7 10.0 59 >53  A Elbow    9.0  7.4          EMG   Side Muscle Nerve Root Ins Act Fibs Psw Amp Dur Poly Recrt Int Bruna Comment  Left Abd Poll Brev Median C8-T1 Nml Nml Nml Nml Nml 0 Nml Nml   Left 1stDorInt Ulnar C8-T1 Nml Nml Nml Nml Nml 0 Nml Nml   Left PronatorTeres  Median C6-7 Nml Nml Nml Nml Nml 0 Nml Nml   Left Biceps Musculocut  C5-6 Nml Nml Nml Nml Nml 0 Nml Nml   Left Deltoid Axillary C5-6 Nml Nml Nml Nml Nml 0 Nml Nml     Nerve Conduction Studies Anti Sensory Left/Right Comparison   Stim Site L Lat (ms) R Lat (ms) L-R Lat (ms) L Amp (V) R Amp (V) L-R Amp (%) Site1 Site2 L Vel (m/s) R Vel (m/s) L-R Vel (m/s)  Median Acr Palm Anti Sensory (2nd Digit)  30.1C  Wrist 3.6 *4.1 0.5 30.4 18.7 38.5 Wrist Palm     Palm 1.9 1.9 0.0 20.7 9.5 54.1       Radial Anti Sensory (Base 1st Digit)  30C  Wrist 2.1 2.0 0.1 32.5 43.5 25.3 Wrist Base 1st Digit     Ulnar Anti Sensory (5th Digit)  30.3C  Wrist 3.4 3.3 0.1 19.1 22.2 14.0 Wrist 5th Digit 41 42 1   Motor Left/Right Comparison   Stim Site L Lat (ms) R Lat (ms) L-R Lat (ms) L Amp (mV) R Amp (mV) L-R Amp (%) Site1 Site2 L Vel (m/s) R Vel (m/s) L-R Vel (m/s)  Median Motor (Abd Poll Brev)  30.4C  Wrist 3.9 4.0 0.1 10.4 10.3 1.0 Elbow Wrist 53 52 1  Elbow 8.4 8.3 0.1 10.6 10.0 5.7       Ulnar Motor (Abd Dig Min)  30.5C  Wrist 3.1 3.4 0.3 8.8 7.3 17.0 B Elbow Wrist 54 54 0  B Elbow 6.8 7.3 0.5 7.6 6.3 17.1 A Elbow B Elbow 62 59 3  A Elbow 8.4 9.0 0.6 6.7 7.4 9.5          Waveforms:                     Clinical History: No specialty comments available.   She reports that she has been smoking cigarettes. She has a 5 pack-year smoking history. She has never used smokeless tobacco. No results for input(s): HGBA1C, LABURIC in the last 8760 hours.  Objective:  VS:  HT:    WT:   BMI:     BP:   HR: bpm  TEMP: ( )  RESP:  Physical Exam Vitals and nursing note reviewed.  Constitutional:      General: She is not in acute distress.    Appearance: Normal appearance. She is well-developed. She is obese. She is not ill-appearing.  HENT:     Head: Normocephalic and atraumatic.  Eyes:     Conjunctiva/sclera: Conjunctivae normal.     Pupils: Pupils are equal, round, and reactive to light.   Cardiovascular:     Rate and Rhythm: Normal rate.     Pulses: Normal pulses.  Pulmonary:     Effort: Pulmonary effort is normal.  Musculoskeletal:        General: No swelling, tenderness or deformity.     Right lower leg: No edema.     Left lower leg: No edema.     Comments: Inspection reveals no atrophy of the bilateral APB or FDI or hand intrinsics. There is no swelling, color changes, allodynia or dystrophic changes. There is 5 out of 5 strength in the bilateral wrist extension, finger abduction and long finger flexion. There is intact sensation to light touch in all dermatomal and peripheral nerve distributions. There is a negative Froment's test bilaterally. There is a negative Tinel's test at the bilateral wrist and elbow. There is a negative Phalen's test bilaterally. There is a negative Hoffmann's test bilaterally.  Skin:    General: Skin is  warm and dry.     Findings: No erythema or rash.  Neurological:     General: No focal deficit present.     Mental Status: She is alert and oriented to person, place, and time.     Sensory: No sensory deficit.     Motor: No weakness or abnormal muscle tone.     Coordination: Coordination normal.     Gait: Gait normal.  Psychiatric:        Mood and Affect: Mood normal.        Behavior: Behavior normal.     Ortho Exam  Imaging: No results found.  Past Medical/Family/Surgical/Social History: Medications & Allergies reviewed per EMR, new medications updated. 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   Reactive thrombocytosis 11/15/2023   Iron  deficiency anemia due to chronic blood loss 02/12/2022   Menorrhagia 02/12/2022   Vitamin D  deficiency 02/12/2022   Prediabetes 02/15/2017   Obesity (BMI 30.0-34.9) 02/15/2017   Tobacco  dependence 01/04/2017   Past Medical History:  Diagnosis Date   Anemia    Depression    GERD (gastroesophageal reflux disease)    Irregular heart beat    Menorrhagia    Prediabetes    Family History  Problem Relation Age of Onset   Diabetes Mother    Hypertension Mother    Breast cancer Mother    Breast cancer Maternal Grandmother    Anesthesia problems Neg Hx    Hypotension Neg Hx    Malignant hyperthermia Neg Hx    Pseudochol deficiency Neg Hx    Past Surgical History:  Procedure Laterality Date   BREAST EXCISIONAL BIOPSY     OTHER SURGICAL HISTORY     fibroids removed from breasts   TUBAL LIGATION  12/2000   Social History   Occupational History   Occupation: unemployed  Tobacco Use   Smoking status: Every Day    Current packs/day: 0.25    Average packs/day: 0.3 packs/day for 20.0 years (5.0 ttl pk-yrs)    Types: Cigarettes   Smokeless tobacco: Never  Vaping Use   Vaping status: Never Used  Substance and Sexual Activity   Alcohol use: Yes    Comment: Socially   Drug use: Yes    Types: Marijuana    Comment: Socially   Sexual activity: Yes    Partners: Male    Birth control/protection: Surgical

## 2024-04-30 ENCOUNTER — Encounter: Payer: Self-pay | Admitting: Physical Medicine and Rehabilitation

## 2024-04-30 NOTE — Procedures (Signed)
 EMG & NCV Findings: Evaluation of the right median (across palm) sensory nerve showed prolonged distal peak latency (Wrist, 4.1 ms).  All remaining nerves (as indicated in the following tables) were within normal limits.  All left vs. right side differences were within normal limits.    All examined muscles (as indicated in the following table) showed no evidence of electrical instability.    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 There is no significant electrodiagnostic evidence of any other focal nerve entrapment, brachial plexopathy or cervical radiculopathy.   Recommendations: 1.  Follow-up with referring physician. 2.  Continue current management of symptoms. 3.  Continue use of resting splint at night-time and as needed during the day.  ___________________________ Prentice Eldonna BETTERS Board Certified, American Board of Physical Medicine and Rehabilitation    Nerve Conduction Studies Anti Sensory Summary Table   Stim Site NR Peak (ms) Norm Peak (ms) P-T Amp (V) Norm P-T Amp Site1 Site2 Delta-P (ms) Dist (cm) Vel (m/s) Norm Vel (m/s)  Left Median Acr Palm Anti Sensory (2nd Digit)  30.1C  Wrist    3.6 <3.6 30.4 >10 Wrist Palm 1.7 0.0    Palm    1.9 <2.0 20.7         Right Median Acr Palm Anti Sensory (2nd Digit)  29.7C  Wrist    *4.1 <3.6 18.7 >10 Wrist Palm 2.2 0.0    Palm    1.9 <2.0 9.5         Left Radial Anti Sensory (Base 1st Digit)  30C  Wrist    2.1 <3.1 32.5  Wrist Base 1st Digit 2.1 0.0    Right Radial Anti Sensory (Base 1st Digit)  30C  Wrist    2.0 <3.1 43.5  Wrist Base 1st Digit 2.0 0.0    Left Ulnar Anti Sensory (5th Digit)  30.3C  Wrist    3.4 <3.7 19.1 >15.0 Wrist 5th Digit 3.4 14.0 41 >38  Right Ulnar Anti Sensory (5th Digit)  30.2C  Wrist    3.3 <3.7 22.2 >15.0 Wrist 5th Digit 3.3 14.0 42 >38   Motor  Summary Table   Stim Site NR Onset (ms) Norm Onset (ms) O-P Amp (mV) Norm O-P Amp Site1 Site2 Delta-0 (ms) Dist (cm) Vel (m/s) Norm Vel (m/s)  Left Median Motor (Abd Poll Brev)  30.4C  Wrist    3.9 <4.2 10.4 >5 Elbow Wrist 4.5 24.0 53 >50  Elbow    8.4  10.6         Right Median Motor (Abd Poll Brev)  30.3C  Wrist    4.0 <4.2 10.3 >5 Elbow Wrist 4.3 22.5 52 >50  Elbow    8.3  10.0         Left Ulnar Motor (Abd Dig Min)  30.5C  Wrist    3.1 <4.2 8.8 >3 B Elbow Wrist 3.7 20.0 54 >53  B Elbow    6.8  7.6  A Elbow B Elbow 1.6 10.0 62 >53  A Elbow    8.4  6.7         Right Ulnar Motor (Abd Dig Min)  30.6C  Wrist    3.4 <4.2 7.3 >3 B Elbow Wrist 3.9 21.0 54 >53  B Elbow    7.3  6.3  A Elbow B Elbow 1.7 10.0 59 >53  A Elbow    9.0  7.4  EMG   Side Muscle Nerve Root Ins Act Fibs Psw Amp Dur Poly Recrt Int Bruna Comment  Left Abd Poll Brev Median C8-T1 Nml Nml Nml Nml Nml 0 Nml Nml   Left 1stDorInt Ulnar C8-T1 Nml Nml Nml Nml Nml 0 Nml Nml   Left PronatorTeres Median C6-7 Nml Nml Nml Nml Nml 0 Nml Nml   Left Biceps Musculocut C5-6 Nml Nml Nml Nml Nml 0 Nml Nml   Left Deltoid Axillary C5-6 Nml Nml Nml Nml Nml 0 Nml Nml     Nerve Conduction Studies Anti Sensory Left/Right Comparison   Stim Site L Lat (ms) R Lat (ms) L-R Lat (ms) L Amp (V) R Amp (V) L-R Amp (%) Site1 Site2 L Vel (m/s) R Vel (m/s) L-R Vel (m/s)  Median Acr Palm Anti Sensory (2nd Digit)  30.1C  Wrist 3.6 *4.1 0.5 30.4 18.7 38.5 Wrist Palm     Palm 1.9 1.9 0.0 20.7 9.5 54.1       Radial Anti Sensory (Base 1st Digit)  30C  Wrist 2.1 2.0 0.1 32.5 43.5 25.3 Wrist Base 1st Digit     Ulnar Anti Sensory (5th Digit)  30.3C  Wrist 3.4 3.3 0.1 19.1 22.2 14.0 Wrist 5th Digit 41 42 1   Motor Left/Right Comparison   Stim Site L Lat (ms) R Lat (ms) L-R Lat (ms) L Amp (mV) R Amp (mV) L-R Amp (%) Site1 Site2 L Vel (m/s) R Vel (m/s) L-R Vel (m/s)  Median Motor (Abd Poll Brev)  30.4C  Wrist 3.9 4.0 0.1 10.4 10.3 1.0 Elbow  Wrist 53 52 1  Elbow 8.4 8.3 0.1 10.6 10.0 5.7       Ulnar Motor (Abd Dig Min)  30.5C  Wrist 3.1 3.4 0.3 8.8 7.3 17.0 B Elbow Wrist 54 54 0  B Elbow 6.8 7.3 0.5 7.6 6.3 17.1 A Elbow B Elbow 62 59 3  A Elbow 8.4 9.0 0.6 6.7 7.4 9.5          Waveforms:

## 2024-06-22 ENCOUNTER — Encounter: Payer: Self-pay | Admitting: Radiology

## 2024-07-06 ENCOUNTER — Ambulatory Visit
Admission: EM | Admit: 2024-07-06 | Discharge: 2024-07-06 | Disposition: A | Discharge status: Home / Self Care | Attending: Family Medicine | Admitting: Family Medicine

## 2024-07-06 DIAGNOSIS — J0100 Acute maxillary sinusitis, unspecified: Secondary | ICD-10-CM

## 2024-07-06 DIAGNOSIS — Z599 Problem related to housing and economic circumstances, unspecified: Secondary | ICD-10-CM | POA: Insufficient documentation

## 2024-07-06 DIAGNOSIS — Z5900 Homelessness unspecified: Secondary | ICD-10-CM | POA: Insufficient documentation

## 2024-07-06 DIAGNOSIS — Z5902 Unsheltered homelessness: Secondary | ICD-10-CM | POA: Insufficient documentation

## 2024-07-06 MED ORDER — METHYLPREDNISOLONE 4 MG PO TBPK: ORAL_TABLET | ORAL | 0 refills | Status: AC

## 2024-07-06 MED ORDER — IBUPROFEN 800 MG PO TABS
800.0000 mg | ORAL_TABLET | Freq: Once | ORAL | Status: AC
  Administered 2024-07-06: 800 mg via ORAL

## 2024-07-06 MED ORDER — DOXYCYCLINE HYCLATE 100 MG PO CAPS
100.0000 mg | ORAL_CAPSULE | Freq: Two times a day (BID) | ORAL | 0 refills | Status: AC
Start: 2024-07-06 — End: ?

## 2024-07-06 NOTE — ED Triage Notes (Signed)
 Patient states symptoms began with sneezing/coughing last week which has went to my head. I got to work today and began with a headache, ears popping and lymph nodes swollen (in neck). No fever known.

## 2024-07-06 NOTE — Discharge Instructions (Addendum)
 You were seen today for a sinus infection.  I have sent out a steroid and antibiotic today.  I have given you motrin  for headache today.   Please get rest and fluids.  If you have worsening pain, or develop nausea and vomiting, then please go to the ER for further evaluation.

## 2024-07-06 NOTE — ED Provider Notes (Signed)
 [facility]    CSN: [id number] Arrival date & time: 07/06/24  1406      History   Chief Complaint Chief Complaint  Patient presents with   Sore Throat   Lymphadenopathy   Headache    HPI SPRUHA WEIGHT is a 48 y.o. female.    Sore Throat Associated symptoms include headaches.  Headache Associated symptoms: congestion, cough, fatigue and sore throat    Patient is here for URI symptoms x 1 week.  Started with cough and sneezing last week.  Now with sinus congestion, drainage, and sinus pain and pressure that started last night.  She is having pain in her ears/neck as well.  No fevers/chills.  Took otc medication while at work today.       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   Low HDL (under 40) 04/24/2024   Sheltered homelessness 04/24/2024   Other iron  deficiency anemias 04/24/2024   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 Authorizing Provider  Homeopathic Products (COLD-EEZE NA) Place into the nose.   Yes [provider]  metroNIDAZOLE  (FLAGYL ) 500 MG tablet Take 500 mg by mouth 3 (three) times daily. 05/13/24  Yes [provider]  calcium carbonate (OS-CAL) 1250 (500 Ca) MG chewable tablet Chew 1,250 mg by mouth.    [provider]  Ferric Maltol (ACCRUFER) 30 MG CAPS 1 cap(s) orally 2 times a day; Duration: 30 days 09/26/23   [provider]  methocarbamol (ROBAXIN) 500 MG tablet Take 500 mg by mouth 3 (three) times daily. 04/10/24   [provider]  Na Sulfate-K Sulfate-Mg Sulfate concentrate (SUPREP) 17.5-3.13-1.6 GM/177ML SOLN Take 177 mLs by mouth. 10/24/23   [provider]  Vitamin D , Ergocalciferol , (DRISDOL) 1.25 MG (50000 UNIT) CAPS capsule 1 cap(s) orally once a week; Duration: 30 days 01/10/24   [provider]    Family History Family History  Problem Relation Age of Onset   Diabetes Mother    Hypertension Mother    Breast cancer Mother    Breast cancer Maternal Grandmother  Anesthesia problems Neg Hx    Hypotension Neg Hx    Malignant hyperthermia Neg Hx    Pseudochol deficiency Neg Hx     Social History Social History   Tobacco Use   Smoking status: Former    Current packs/day: 0.25    Average packs/day: 0.3 packs/day for 20.0 years (5.0 ttl pk-yrs)    Types: Cigarettes   Smokeless tobacco: Never  Vaping Use   Vaping status: Never Used  Substance Use Topics   Alcohol use: Yes    Comment: Socially   Drug use: Yes    Types: Marijuana    Comment: Socially     Allergies   Penicillins   Review of Systems Review of Systems  Constitutional:  Positive for fatigue.  HENT:  Positive for congestion, rhinorrhea and sore throat.   Respiratory:  Positive for cough.   Cardiovascular: Negative.   Gastrointestinal: Negative.   Musculoskeletal: Negative.    Neurological:  Positive for headaches.     Physical Exam Triage Vital Signs ED Triage Vitals  Encounter Vitals Group     BP 07/06/24 1539 110/74     Girls Systolic BP Percentile --      Girls Diastolic BP Percentile --      Boys Systolic BP Percentile --      Boys Diastolic BP Percentile --      Pulse Rate 07/06/24 1539 88     Resp 07/06/24 1539 20     Temp 07/06/24 1539 99.1 F (37.3 C)     Temp Source 07/06/24 1539 Oral     SpO2 07/06/24 1539 99 %     Weight 07/06/24 1536 205 lb (93 kg)     Height 07/06/24 1536 5' 10 (1.778 m)     Head Circumference --      Peak Flow --      Pain Score 07/06/24 1536 9     Pain Loc --      Pain Education --      Exclude from Growth Chart --    No data found.  Updated Vital Signs BP 110/74 (BP Location: Left Arm)   Pulse 88   Temp 99.1 F (37.3 C) (Oral)   Resp 20   Ht 5' 10 (1.778 m)   Wt 93 kg   LMP 07/05/2024 (Exact Date)   SpO2 99%   BMI 29.41 kg/m   Visual Acuity Right Eye Distance:   Left Eye Distance:   Bilateral Distance:    Right Eye Near:   Left Eye Near:    Bilateral Near:     Physical Exam Constitutional:      General: She is not in acute distress.    Appearance: She is well-developed and normal weight. She is not ill-appearing or toxic-appearing.  HENT:     Right Ear: A middle ear effusion is present.     Left Ear: A middle ear effusion is present.     Nose: Congestion and rhinorrhea present.     Right Sinus: Maxillary sinus tenderness present.     Left Sinus: Maxillary sinus tenderness present.     Mouth/Throat:     Mouth: Mucous membranes are moist.     Pharynx: No posterior oropharyngeal erythema.  Cardiovascular:     Rate and Rhythm: Normal rate and regular rhythm.     Heart sounds: Normal heart sounds.  Pulmonary:     Effort: Pulmonary effort is normal.     Breath sounds: Normal breath sounds. No wheezing or rhonchi.  Musculoskeletal:     Cervical back: Normal range of motion and neck  supple.  Skin:    General: Skin is warm.  Neurological:     General: No focal deficit present.     Mental Status: She is alert.  Psychiatric:        Mood and Affect: Mood normal.      UC Treatments / Results  Labs (all labs ordered are listed, but only abnormal results are displayed) Labs Reviewed - No data to display  EKG   Radiology No results found.  Procedures Procedures (including critical care time)  Medications Ordered in UC Medications  ibuprofen  (ADVIL ) tablet 800 mg (800 mg Oral Given 07/06/24 1551)    Initial Impression / Assessment and Plan / UC Course  I have reviewed the triage vital signs and the nursing notes.  Pertinent labs & imaging results that were available during my care of the patient were reviewed by me and considered in my medical decision making (see chart for details).   Final Clinical Impressions(s) / UC Diagnoses   Final diagnoses:  Acute non-recurrent maxillary sinusitis     Discharge Instructions      You were seen today for a sinus infection.  I have sent out a steroid and antibiotic today.  I have given you motrin  for headache today.   Please get rest and fluids.  If you have worsening pain, or develop nausea and vomiting, then please go to the ER for further evaluation.     ED Prescriptions     Medication Sig Dispense Auth. Provider   doxycycline  (VIBRAMYCIN ) 100 MG capsule Take 1 capsule (100 mg total) by mouth 2 (two) times daily. 20 capsule Pegge Cumberledge, Rocky, MD   methylPREDNISolone (MEDROL DOSEPAK) 4 MG TBPK tablet Take as directed 1 each Darral Rocky, MD      PDMP not reviewed this encounter.   Darral Rocky, MD 07/06/24 1600

## 2024-10-05 ENCOUNTER — Other Ambulatory Visit

## 2024-10-05 ENCOUNTER — Ambulatory Visit: Admitting: Hematology and Oncology

## 2024-10-08 ENCOUNTER — Other Ambulatory Visit

## 2024-10-08 ENCOUNTER — Ambulatory Visit: Admitting: Hematology and Oncology
